# Patient Record
Sex: Female | Born: 1980 | Race: Black or African American | Hispanic: No | Marital: Single | Smoking: Never smoker
Health system: Southern US, Community
[De-identification: ages and names within clinical notes are randomized; demographics above are authoritative.]

## PROBLEM LIST (undated history)

## (undated) ENCOUNTER — Inpatient Hospital Stay (HOSPITAL_COMMUNITY): Payer: Self-pay

## (undated) DIAGNOSIS — Z8619 Personal history of other infectious and parasitic diseases: Secondary | ICD-10-CM

## (undated) DIAGNOSIS — A499 Bacterial infection, unspecified: Secondary | ICD-10-CM

## (undated) DIAGNOSIS — R1110 Vomiting, unspecified: Secondary | ICD-10-CM

## (undated) DIAGNOSIS — Z87448 Personal history of other diseases of urinary system: Secondary | ICD-10-CM

## (undated) DIAGNOSIS — R87629 Unspecified abnormal cytological findings in specimens from vagina: Secondary | ICD-10-CM

## (undated) DIAGNOSIS — Z87440 Personal history of urinary (tract) infections: Secondary | ICD-10-CM

## (undated) DIAGNOSIS — B379 Candidiasis, unspecified: Secondary | ICD-10-CM

## (undated) DIAGNOSIS — IMO0002 Reserved for concepts with insufficient information to code with codable children: Secondary | ICD-10-CM

## (undated) DIAGNOSIS — O09899 Supervision of other high risk pregnancies, unspecified trimester: Secondary | ICD-10-CM

## (undated) DIAGNOSIS — F419 Anxiety disorder, unspecified: Secondary | ICD-10-CM

## (undated) DIAGNOSIS — D649 Anemia, unspecified: Secondary | ICD-10-CM

## (undated) HISTORY — DX: Supervision of other high risk pregnancies, unspecified trimester: O09.899

## (undated) HISTORY — DX: Reserved for concepts with insufficient information to code with codable children: IMO0002

## (undated) HISTORY — PX: NO PAST SURGERIES: SHX2092

## (undated) HISTORY — DX: Unspecified abnormal cytological findings in specimens from vagina: R87.629

## (undated) HISTORY — DX: Personal history of urinary (tract) infections: Z87.440

## (undated) HISTORY — DX: Personal history of other diseases of urinary system: Z87.448

## (undated) HISTORY — DX: Anemia, unspecified: D64.9

## (undated) HISTORY — DX: Bacterial infection, unspecified: A49.9

## (undated) HISTORY — DX: Candidiasis, unspecified: B37.9

## (undated) HISTORY — DX: Personal history of other infectious and parasitic diseases: Z86.19

---

## 2000-04-27 ENCOUNTER — Emergency Department (HOSPITAL_COMMUNITY): Admission: EM | Admit: 2000-04-27 | Discharge: 2000-04-27 | Payer: Self-pay | Admitting: Emergency Medicine

## 2000-11-25 ENCOUNTER — Emergency Department (HOSPITAL_COMMUNITY): Admission: EM | Admit: 2000-11-25 | Discharge: 2000-11-25 | Payer: Self-pay | Admitting: Emergency Medicine

## 2000-12-04 DIAGNOSIS — D649 Anemia, unspecified: Secondary | ICD-10-CM

## 2000-12-04 DIAGNOSIS — O09899 Supervision of other high risk pregnancies, unspecified trimester: Secondary | ICD-10-CM

## 2000-12-04 HISTORY — DX: Anemia, unspecified: D64.9

## 2000-12-04 HISTORY — DX: Supervision of other high risk pregnancies, unspecified trimester: O09.899

## 2000-12-10 ENCOUNTER — Emergency Department (HOSPITAL_COMMUNITY): Admission: EM | Admit: 2000-12-10 | Discharge: 2000-12-11 | Payer: Self-pay | Admitting: Emergency Medicine

## 2000-12-11 ENCOUNTER — Inpatient Hospital Stay (HOSPITAL_COMMUNITY): Admission: AD | Admit: 2000-12-11 | Discharge: 2000-12-11 | Payer: Self-pay | Admitting: *Deleted

## 2000-12-28 ENCOUNTER — Inpatient Hospital Stay (HOSPITAL_COMMUNITY): Admission: AD | Admit: 2000-12-28 | Discharge: 2000-12-28 | Payer: Self-pay | Admitting: *Deleted

## 2001-05-18 ENCOUNTER — Inpatient Hospital Stay (HOSPITAL_COMMUNITY): Admission: AD | Admit: 2001-05-18 | Discharge: 2001-05-18 | Payer: Self-pay | Admitting: Obstetrics & Gynecology

## 2001-06-28 ENCOUNTER — Inpatient Hospital Stay (HOSPITAL_COMMUNITY): Admission: AD | Admit: 2001-06-28 | Discharge: 2001-06-28 | Payer: Self-pay | Admitting: Obstetrics and Gynecology

## 2001-07-07 ENCOUNTER — Inpatient Hospital Stay (HOSPITAL_COMMUNITY): Admission: AD | Admit: 2001-07-07 | Discharge: 2001-07-07 | Payer: Self-pay | Admitting: Obstetrics and Gynecology

## 2001-07-20 ENCOUNTER — Inpatient Hospital Stay (HOSPITAL_COMMUNITY): Admission: AD | Admit: 2001-07-20 | Discharge: 2001-07-22 | Payer: Self-pay | Admitting: Obstetrics and Gynecology

## 2001-08-22 ENCOUNTER — Other Ambulatory Visit: Admission: RE | Admit: 2001-08-22 | Discharge: 2001-08-22 | Payer: Self-pay | Admitting: Obstetrics and Gynecology

## 2002-01-26 ENCOUNTER — Emergency Department (HOSPITAL_COMMUNITY): Admission: EM | Admit: 2002-01-26 | Discharge: 2002-01-26 | Payer: Self-pay | Admitting: Emergency Medicine

## 2003-02-28 ENCOUNTER — Emergency Department (HOSPITAL_COMMUNITY): Admission: EM | Admit: 2003-02-28 | Discharge: 2003-03-01 | Payer: Self-pay | Admitting: Emergency Medicine

## 2003-09-02 ENCOUNTER — Emergency Department (HOSPITAL_COMMUNITY): Admission: EM | Admit: 2003-09-02 | Discharge: 2003-09-02 | Payer: Self-pay | Admitting: Emergency Medicine

## 2003-09-02 ENCOUNTER — Encounter: Payer: Self-pay | Admitting: Emergency Medicine

## 2003-09-18 ENCOUNTER — Emergency Department (HOSPITAL_COMMUNITY): Admission: EM | Admit: 2003-09-18 | Discharge: 2003-09-18 | Payer: Self-pay | Admitting: Emergency Medicine

## 2003-12-29 ENCOUNTER — Emergency Department (HOSPITAL_COMMUNITY): Admission: AD | Admit: 2003-12-29 | Discharge: 2003-12-29 | Payer: Self-pay | Admitting: Family Medicine

## 2004-04-14 ENCOUNTER — Emergency Department (HOSPITAL_COMMUNITY): Admission: EM | Admit: 2004-04-14 | Discharge: 2004-04-14 | Payer: Self-pay | Admitting: *Deleted

## 2004-09-04 ENCOUNTER — Emergency Department (HOSPITAL_COMMUNITY): Admission: EM | Admit: 2004-09-04 | Discharge: 2004-09-04 | Payer: Self-pay | Admitting: Family Medicine

## 2004-12-04 DIAGNOSIS — R87619 Unspecified abnormal cytological findings in specimens from cervix uteri: Secondary | ICD-10-CM

## 2004-12-04 DIAGNOSIS — IMO0002 Reserved for concepts with insufficient information to code with codable children: Secondary | ICD-10-CM

## 2004-12-04 HISTORY — DX: Unspecified abnormal cytological findings in specimens from cervix uteri: R87.619

## 2004-12-04 HISTORY — DX: Reserved for concepts with insufficient information to code with codable children: IMO0002

## 2005-06-25 ENCOUNTER — Emergency Department (HOSPITAL_COMMUNITY): Admission: EM | Admit: 2005-06-25 | Discharge: 2005-06-25 | Payer: Self-pay | Admitting: Family Medicine

## 2005-11-17 ENCOUNTER — Emergency Department (HOSPITAL_COMMUNITY): Admission: EM | Admit: 2005-11-17 | Discharge: 2005-11-17 | Payer: Self-pay | Admitting: Family Medicine

## 2005-11-20 ENCOUNTER — Inpatient Hospital Stay (HOSPITAL_COMMUNITY): Admission: AD | Admit: 2005-11-20 | Discharge: 2005-11-20 | Payer: Self-pay | Admitting: Obstetrics & Gynecology

## 2005-12-12 ENCOUNTER — Emergency Department (HOSPITAL_COMMUNITY): Admission: EM | Admit: 2005-12-12 | Discharge: 2005-12-12 | Payer: Self-pay | Admitting: Family Medicine

## 2006-05-22 ENCOUNTER — Emergency Department (HOSPITAL_COMMUNITY): Admission: EM | Admit: 2006-05-22 | Discharge: 2006-05-22 | Payer: Self-pay | Admitting: Family Medicine

## 2006-06-13 ENCOUNTER — Other Ambulatory Visit: Admission: RE | Admit: 2006-06-13 | Discharge: 2006-06-13 | Payer: Self-pay | Admitting: Family Medicine

## 2006-09-27 ENCOUNTER — Emergency Department (HOSPITAL_COMMUNITY): Admission: EM | Admit: 2006-09-27 | Discharge: 2006-09-27 | Payer: Self-pay | Admitting: Emergency Medicine

## 2006-11-09 ENCOUNTER — Emergency Department (HOSPITAL_COMMUNITY): Admission: EM | Admit: 2006-11-09 | Discharge: 2006-11-09 | Payer: Self-pay | Admitting: Emergency Medicine

## 2006-11-12 ENCOUNTER — Emergency Department (HOSPITAL_COMMUNITY): Admission: EM | Admit: 2006-11-12 | Discharge: 2006-11-12 | Payer: Self-pay | Admitting: Family Medicine

## 2007-02-26 ENCOUNTER — Emergency Department (HOSPITAL_COMMUNITY): Admission: EM | Admit: 2007-02-26 | Discharge: 2007-02-26 | Payer: Self-pay | Admitting: Family Medicine

## 2007-03-19 ENCOUNTER — Emergency Department (HOSPITAL_COMMUNITY): Admission: EM | Admit: 2007-03-19 | Discharge: 2007-03-19 | Payer: Self-pay | Admitting: Family Medicine

## 2007-04-01 ENCOUNTER — Emergency Department (HOSPITAL_COMMUNITY): Admission: EM | Admit: 2007-04-01 | Discharge: 2007-04-01 | Payer: Self-pay | Admitting: Family Medicine

## 2007-07-11 ENCOUNTER — Emergency Department (HOSPITAL_COMMUNITY): Admission: EM | Admit: 2007-07-11 | Discharge: 2007-07-11 | Payer: Self-pay | Admitting: Emergency Medicine

## 2007-08-19 ENCOUNTER — Emergency Department (HOSPITAL_COMMUNITY): Admission: EM | Admit: 2007-08-19 | Discharge: 2007-08-19 | Payer: Self-pay | Admitting: Family Medicine

## 2007-10-03 ENCOUNTER — Emergency Department (HOSPITAL_COMMUNITY): Admission: EM | Admit: 2007-10-03 | Discharge: 2007-10-03 | Payer: Self-pay | Admitting: Family Medicine

## 2007-11-15 ENCOUNTER — Emergency Department (HOSPITAL_COMMUNITY): Admission: EM | Admit: 2007-11-15 | Discharge: 2007-11-15 | Payer: Self-pay | Admitting: Family Medicine

## 2007-11-19 ENCOUNTER — Emergency Department (HOSPITAL_COMMUNITY): Admission: EM | Admit: 2007-11-19 | Discharge: 2007-11-19 | Payer: Self-pay | Admitting: Emergency Medicine

## 2008-02-27 ENCOUNTER — Emergency Department (HOSPITAL_COMMUNITY): Admission: EM | Admit: 2008-02-27 | Discharge: 2008-02-27 | Payer: Self-pay | Admitting: Family Medicine

## 2008-04-29 ENCOUNTER — Emergency Department (HOSPITAL_COMMUNITY): Admission: EM | Admit: 2008-04-29 | Discharge: 2008-04-29 | Payer: Self-pay | Admitting: Emergency Medicine

## 2008-05-06 ENCOUNTER — Emergency Department (HOSPITAL_COMMUNITY): Admission: EM | Admit: 2008-05-06 | Discharge: 2008-05-06 | Payer: Self-pay | Admitting: Emergency Medicine

## 2008-05-26 ENCOUNTER — Emergency Department (HOSPITAL_COMMUNITY): Admission: EM | Admit: 2008-05-26 | Discharge: 2008-05-26 | Payer: Self-pay | Admitting: Family Medicine

## 2008-06-03 ENCOUNTER — Emergency Department (HOSPITAL_COMMUNITY): Admission: EM | Admit: 2008-06-03 | Discharge: 2008-06-03 | Payer: Self-pay | Admitting: Family Medicine

## 2008-12-04 DIAGNOSIS — Z8619 Personal history of other infectious and parasitic diseases: Secondary | ICD-10-CM

## 2008-12-04 HISTORY — DX: Personal history of other infectious and parasitic diseases: Z86.19

## 2009-01-03 ENCOUNTER — Emergency Department (HOSPITAL_COMMUNITY): Admission: EM | Admit: 2009-01-03 | Discharge: 2009-01-03 | Payer: Self-pay | Admitting: Family Medicine

## 2009-03-25 ENCOUNTER — Emergency Department (HOSPITAL_COMMUNITY): Admission: EM | Admit: 2009-03-25 | Discharge: 2009-03-25 | Payer: Self-pay | Admitting: Family Medicine

## 2009-04-27 ENCOUNTER — Emergency Department (HOSPITAL_COMMUNITY): Admission: EM | Admit: 2009-04-27 | Discharge: 2009-04-27 | Payer: Self-pay | Admitting: Family Medicine

## 2009-07-03 ENCOUNTER — Emergency Department (HOSPITAL_COMMUNITY): Admission: EM | Admit: 2009-07-03 | Discharge: 2009-07-03 | Payer: Self-pay | Admitting: Emergency Medicine

## 2009-10-04 ENCOUNTER — Emergency Department (HOSPITAL_COMMUNITY): Admission: EM | Admit: 2009-10-04 | Discharge: 2009-10-04 | Payer: Self-pay | Admitting: Family Medicine

## 2009-12-04 DIAGNOSIS — Z87448 Personal history of other diseases of urinary system: Secondary | ICD-10-CM

## 2009-12-04 HISTORY — DX: Personal history of other diseases of urinary system: Z87.448

## 2009-12-04 HISTORY — PX: INDUCED ABORTION: SHX677

## 2010-01-19 ENCOUNTER — Emergency Department (HOSPITAL_COMMUNITY): Admission: EM | Admit: 2010-01-19 | Discharge: 2010-01-19 | Payer: Self-pay | Admitting: Emergency Medicine

## 2010-03-26 ENCOUNTER — Inpatient Hospital Stay (HOSPITAL_COMMUNITY): Admission: AD | Admit: 2010-03-26 | Discharge: 2010-03-26 | Payer: Self-pay | Admitting: Obstetrics and Gynecology

## 2010-03-31 ENCOUNTER — Inpatient Hospital Stay (HOSPITAL_COMMUNITY): Admission: AD | Admit: 2010-03-31 | Discharge: 2010-03-31 | Payer: Self-pay | Admitting: Obstetrics & Gynecology

## 2010-11-30 ENCOUNTER — Emergency Department (HOSPITAL_COMMUNITY)
Admission: EM | Admit: 2010-11-30 | Discharge: 2010-11-30 | Payer: Self-pay | Source: Home / Self Care | Admitting: Family Medicine

## 2010-12-04 DIAGNOSIS — Z87440 Personal history of urinary (tract) infections: Secondary | ICD-10-CM

## 2010-12-04 HISTORY — DX: Personal history of urinary (tract) infections: Z87.440

## 2011-02-13 LAB — URINE CULTURE
Colony Count: 75000
Culture  Setup Time: 201112282138

## 2011-02-13 LAB — POCT URINALYSIS DIPSTICK
Bilirubin Urine: NEGATIVE
Glucose, UA: NEGATIVE mg/dL
Ketones, ur: NEGATIVE mg/dL
Nitrite: NEGATIVE
Protein, ur: 100 mg/dL — AB
Specific Gravity, Urine: 1.015 (ref 1.005–1.030)
Urobilinogen, UA: 4 mg/dL — ABNORMAL HIGH (ref 0.0–1.0)
pH: 7.5 (ref 5.0–8.0)

## 2011-02-13 LAB — POCT PREGNANCY, URINE: Preg Test, Ur: NEGATIVE

## 2011-02-21 LAB — URINALYSIS, ROUTINE W REFLEX MICROSCOPIC
Bilirubin Urine: NEGATIVE
Bilirubin Urine: NEGATIVE
Glucose, UA: NEGATIVE mg/dL
Glucose, UA: NEGATIVE mg/dL
Ketones, ur: 40 mg/dL — AB
Ketones, ur: NEGATIVE mg/dL
Nitrite: POSITIVE — AB
Nitrite: POSITIVE — AB
Protein, ur: NEGATIVE mg/dL
Protein, ur: NEGATIVE mg/dL
Specific Gravity, Urine: 1.02 (ref 1.005–1.030)
Specific Gravity, Urine: 1.025 (ref 1.005–1.030)
Urobilinogen, UA: 0.2 mg/dL (ref 0.0–1.0)
Urobilinogen, UA: 2 mg/dL — ABNORMAL HIGH (ref 0.0–1.0)
pH: 6 (ref 5.0–8.0)
pH: 6 (ref 5.0–8.0)

## 2011-02-21 LAB — URINE MICROSCOPIC-ADD ON

## 2011-02-21 LAB — URINE CULTURE: Colony Count: >=100000

## 2011-02-21 LAB — CBC
HCT: 39.3 % (ref 36.0–46.0)
Hemoglobin: 13.3 g/dL (ref 12.0–15.0)
MCHC: 33.8 g/dL (ref 30.0–36.0)
MCV: 90.2 fL (ref 78.0–100.0)
Platelets: 265 10*3/uL (ref 150–400)
RBC: 4.36 MIL/uL (ref 3.87–5.11)
RDW: 13.6 % (ref 11.5–15.5)
WBC: 7.2 10*3/uL (ref 4.0–10.5)

## 2011-02-21 LAB — WET PREP, GENITAL
Trich, Wet Prep: NONE SEEN
Yeast Wet Prep HPF POC: NONE SEEN

## 2011-02-21 LAB — GC/CHLAMYDIA PROBE AMP, GENITAL
Chlamydia, DNA Probe: NEGATIVE
GC Probe Amp, Genital: NEGATIVE

## 2011-02-21 LAB — POCT PREGNANCY, URINE: Preg Test, Ur: POSITIVE

## 2011-02-21 LAB — HCG, QUANTITATIVE, PREGNANCY: hCG, Beta Chain, Quant, S: 35308 m[IU]/mL — ABNORMAL HIGH (ref <5)

## 2011-03-08 LAB — GC/CHLAMYDIA PROBE AMP, GENITAL
Chlamydia, DNA Probe: NEGATIVE
GC Probe Amp, Genital: POSITIVE — AB

## 2011-03-08 LAB — WET PREP, GENITAL
Trich, Wet Prep: NONE SEEN
Yeast Wet Prep HPF POC: NONE SEEN

## 2011-03-08 LAB — POCT URINALYSIS DIP (DEVICE)
Bilirubin Urine: NEGATIVE
Glucose, UA: NEGATIVE mg/dL
Ketones, ur: NEGATIVE mg/dL
Nitrite: NEGATIVE
Protein, ur: NEGATIVE mg/dL
Specific Gravity, Urine: 1.015 (ref 1.005–1.030)
Urobilinogen, UA: 0.2 mg/dL (ref 0.0–1.0)
pH: 7.5 (ref 5.0–8.0)

## 2011-03-08 LAB — POCT PREGNANCY, URINE: Preg Test, Ur: NEGATIVE

## 2011-03-12 LAB — POCT PREGNANCY, URINE: Preg Test, Ur: NEGATIVE

## 2011-03-14 LAB — WET PREP, GENITAL
Trich, Wet Prep: NONE SEEN
Yeast Wet Prep HPF POC: NONE SEEN

## 2011-03-14 LAB — POCT URINALYSIS DIP (DEVICE)
Bilirubin Urine: NEGATIVE
Ketones, ur: NEGATIVE mg/dL
pH: 7 (ref 5.0–8.0)

## 2011-03-14 LAB — HIV ANTIBODY (ROUTINE TESTING W REFLEX): HIV: NONREACTIVE

## 2011-03-14 LAB — POCT PREGNANCY, URINE: Preg Test, Ur: NEGATIVE

## 2011-03-15 LAB — POCT URINALYSIS DIP (DEVICE)
Bilirubin Urine: NEGATIVE
Nitrite: POSITIVE — AB
Urobilinogen, UA: 0.2 mg/dL (ref 0.0–1.0)
pH: 6.5 (ref 5.0–8.0)

## 2011-03-15 LAB — URINE CULTURE: Colony Count: >=100000

## 2011-05-04 ENCOUNTER — Inpatient Hospital Stay (INDEPENDENT_AMBULATORY_CARE_PROVIDER_SITE_OTHER)
Admission: RE | Admit: 2011-05-04 | Discharge: 2011-05-04 | Disposition: A | Discharge status: Home / Self Care | Payer: Self-pay | Source: Ambulatory Visit | Attending: Family Medicine | Admitting: Family Medicine

## 2011-05-04 DIAGNOSIS — N390 Urinary tract infection, site not specified: Secondary | ICD-10-CM

## 2011-05-04 LAB — POCT URINALYSIS DIP (DEVICE)
Glucose, UA: NEGATIVE mg/dL
Specific Gravity, Urine: 1.015 (ref 1.005–1.030)
Urobilinogen, UA: 0.2 mg/dL (ref 0.0–1.0)

## 2011-05-04 LAB — POCT PREGNANCY, URINE: Preg Test, Ur: NEGATIVE

## 2011-06-16 ENCOUNTER — Inpatient Hospital Stay (INDEPENDENT_AMBULATORY_CARE_PROVIDER_SITE_OTHER)
Admission: RE | Admit: 2011-06-16 | Discharge: 2011-06-16 | Disposition: A | Discharge status: Home / Self Care | Payer: Self-pay | Source: Ambulatory Visit | Attending: Emergency Medicine | Admitting: Emergency Medicine

## 2011-06-16 DIAGNOSIS — F410 Panic disorder [episodic paroxysmal anxiety] without agoraphobia: Secondary | ICD-10-CM

## 2011-08-28 LAB — POCT URINALYSIS DIP (DEVICE)
Glucose, UA: NEGATIVE
Nitrite: POSITIVE — AB
Protein, ur: NEGATIVE
Urobilinogen, UA: 0.2
pH: 7.5

## 2011-08-28 LAB — WET PREP, GENITAL
Trich, Wet Prep: NONE SEEN
Yeast Wet Prep HPF POC: NONE SEEN

## 2011-08-28 LAB — GC/CHLAMYDIA PROBE AMP, GENITAL
Chlamydia, DNA Probe: POSITIVE — AB
GC Probe Amp, Genital: NEGATIVE

## 2011-08-28 LAB — POCT PREGNANCY, URINE: Preg Test, Ur: NEGATIVE

## 2011-08-28 LAB — URINE CULTURE

## 2011-08-30 LAB — POCT URINALYSIS DIP (DEVICE)
Bilirubin Urine: NEGATIVE
Glucose, UA: NEGATIVE
Ketones, ur: NEGATIVE
pH: 5.5

## 2011-08-30 LAB — WET PREP, GENITAL
Trich, Wet Prep: NONE SEEN
Yeast Wet Prep HPF POC: NONE SEEN

## 2011-08-30 LAB — GC/CHLAMYDIA PROBE AMP, GENITAL: Chlamydia, DNA Probe: POSITIVE — AB

## 2011-08-30 LAB — URINE CULTURE

## 2011-08-31 LAB — POCT URINALYSIS DIP (DEVICE)
Bilirubin Urine: NEGATIVE
Glucose, UA: NEGATIVE
Ketones, ur: NEGATIVE
Nitrite: NEGATIVE
Nitrite: NEGATIVE
Operator id: 235561
Protein, ur: NEGATIVE
Protein, ur: NEGATIVE
Specific Gravity, Urine: 1.015
Urobilinogen, UA: 0.2
pH: 7

## 2011-08-31 LAB — POCT PREGNANCY, URINE
Operator id: 235561
Preg Test, Ur: NEGATIVE
Preg Test, Ur: NEGATIVE

## 2011-09-08 LAB — POCT PREGNANCY, URINE: Preg Test, Ur: NEGATIVE

## 2011-09-08 LAB — POCT URINALYSIS DIP (DEVICE)
Nitrite: NEGATIVE
Protein, ur: NEGATIVE
Specific Gravity, Urine: <=1.005
Urobilinogen, UA: 0.2
pH: 6.5

## 2011-09-11 LAB — POCT URINALYSIS DIP (DEVICE)
Bilirubin Urine: NEGATIVE
Ketones, ur: NEGATIVE
Protein, ur: 100 — AB
Specific Gravity, Urine: 1.01
pH: 5.5

## 2011-09-11 LAB — POCT PREGNANCY, URINE: Operator id: 239701

## 2011-09-13 LAB — POCT URINALYSIS DIP (DEVICE)
Bilirubin Urine: NEGATIVE
Glucose, UA: NEGATIVE
Nitrite: NEGATIVE
Operator id: 239701
Specific Gravity, Urine: 1.015
Urobilinogen, UA: 0.2

## 2011-09-13 LAB — I-STAT 8, (EC8 V) (CONVERTED LAB)
BUN: 10
Bicarbonate: 29 — ABNORMAL HIGH
HCT: 44
Hemoglobin: 15
Operator id: 239701
Potassium: 3.6
Sodium: 141
TCO2: 31

## 2011-09-14 LAB — URINE CULTURE: Colony Count: >=100000

## 2011-09-14 LAB — POCT URINALYSIS DIP (DEVICE)
Bilirubin Urine: NEGATIVE
Glucose, UA: NEGATIVE
Specific Gravity, Urine: 1.02

## 2011-09-18 LAB — URINE CULTURE: Colony Count: >=100000

## 2011-09-18 LAB — POCT URINALYSIS DIP (DEVICE)
Specific Gravity, Urine: 1.02
Urobilinogen, UA: 0.2
pH: 5.5

## 2011-09-18 LAB — GC/CHLAMYDIA PROBE AMP, GENITAL: Chlamydia, DNA Probe: NEGATIVE

## 2011-11-22 ENCOUNTER — Inpatient Hospital Stay (HOSPITAL_COMMUNITY): Payer: Medicaid Other

## 2011-11-22 ENCOUNTER — Inpatient Hospital Stay (HOSPITAL_COMMUNITY)
Admission: AD | Admit: 2011-11-22 | Discharge: 2011-11-22 | Disposition: A | Discharge status: Home / Self Care | Payer: Medicaid Other | Source: Ambulatory Visit | Attending: Obstetrics and Gynecology | Admitting: Obstetrics and Gynecology

## 2011-11-22 ENCOUNTER — Encounter (HOSPITAL_COMMUNITY): Payer: Self-pay | Admitting: *Deleted

## 2011-11-22 DIAGNOSIS — O2390 Unspecified genitourinary tract infection in pregnancy, unspecified trimester: Secondary | ICD-10-CM | POA: Insufficient documentation

## 2011-11-22 DIAGNOSIS — A499 Bacterial infection, unspecified: Secondary | ICD-10-CM | POA: Insufficient documentation

## 2011-11-22 DIAGNOSIS — B9689 Other specified bacterial agents as the cause of diseases classified elsewhere: Secondary | ICD-10-CM | POA: Insufficient documentation

## 2011-11-22 DIAGNOSIS — O210 Mild hyperemesis gravidarum: Secondary | ICD-10-CM

## 2011-11-22 DIAGNOSIS — N760 Acute vaginitis: Secondary | ICD-10-CM | POA: Insufficient documentation

## 2011-11-22 HISTORY — DX: Vomiting, unspecified: R11.10

## 2011-11-22 LAB — DIFFERENTIAL
Basophils Absolute: 0 10*3/uL (ref 0.0–0.1)
Basophils Relative: 0 % (ref 0–1)
Eosinophils Absolute: 0 10*3/uL (ref 0.0–0.7)
Eosinophils Relative: 0 % (ref 0–5)

## 2011-11-22 LAB — CBC
MCH: 28.7 pg (ref 26.0–34.0)
MCV: 83.9 fL (ref 78.0–100.0)
Platelets: 275 10*3/uL (ref 150–400)
RDW: 13.4 % (ref 11.5–15.5)
WBC: 9.8 10*3/uL (ref 4.0–10.5)

## 2011-11-22 LAB — URINALYSIS, ROUTINE W REFLEX MICROSCOPIC
Glucose, UA: NEGATIVE mg/dL
Ketones, ur: 15 mg/dL — AB
Leukocytes, UA: NEGATIVE
Protein, ur: NEGATIVE mg/dL

## 2011-11-22 LAB — URINE MICROSCOPIC-ADD ON

## 2011-11-22 LAB — COMPREHENSIVE METABOLIC PANEL
Albumin: 3.9 g/dL (ref 3.5–5.2)
BUN: 10 mg/dL (ref 6–23)
Creatinine, Ser: 0.63 mg/dL (ref 0.50–1.10)
Total Protein: 7.7 g/dL (ref 6.0–8.3)

## 2011-11-22 LAB — WET PREP, GENITAL
Trich, Wet Prep: NONE SEEN
Yeast Wet Prep HPF POC: NONE SEEN

## 2011-11-22 LAB — HCG, QUANTITATIVE, PREGNANCY: hCG, Beta Chain, Quant, S: 14753 m[IU]/mL — ABNORMAL HIGH (ref <5)

## 2011-11-22 MED ORDER — PROMETHAZINE HCL 25 MG PO TABS: 25.0000 mg | ORAL_TABLET | Freq: Four times a day (QID) | ORAL | Status: DC | PRN

## 2011-11-22 MED ORDER — ONDANSETRON HCL 4 MG PO TABS: 4.0000 mg | ORAL_TABLET | Freq: Four times a day (QID) | ORAL | Status: DC

## 2011-11-22 MED ORDER — ONDANSETRON HCL 4 MG/2ML IJ SOLN
4.0000 mg | Freq: Once | INTRAMUSCULAR | Status: AC
  Administered 2011-11-22: 4 mg via INTRAVENOUS
  Filled 2011-11-22: qty 2

## 2011-11-22 MED ORDER — METRONIDAZOLE 0.75 % VA GEL: 1.0000 | Freq: Two times a day (BID) | VAGINAL | Status: DC

## 2011-11-22 MED ORDER — LACTATED RINGERS IV SOLN
INTRAVENOUS | Status: DC
  Administered 2011-11-22: 22:00:00 via INTRAVENOUS

## 2011-11-22 NOTE — Progress Notes (Signed)
SSE per Mayer Camel, NP.  Wet prep and cultures collected.  VE done.

## 2011-11-22 NOTE — ED Provider Notes (Signed)
History     CSN: [id number] Arrival date & time: 11/22/2011  7:52 PM   None     Chief Complaint  Patient presents with  . Emesis    HPI Megan Ballard is a air entry female @ 6.[redacted] weeks gestation who presents to MAU for persistent vomiting. Had positive pregnancy test at home 3 weeks ago. Vomiting started today at 11:00 am and has not been able to keep anything down.   Past Medical History  Diagnosis Date  . Hyperemesis     Past Surgical History  Procedure Date  . No past surgeries     History reviewed. No pertinent family history.  History  Substance Use Topics  . Smoking status: Never Smoker   . Smokeless tobacco: Not on file  . Alcohol Use: No    OB History    Grav Para Term Preterm Abortions TAB SAB Ect Mult Living   1               Review of Systems  Constitutional: Positive for chills and fatigue. Negative for fever and diaphoresis.  HENT: Negative for ear pain, congestion, sore throat, facial swelling, neck pain, neck stiffness, dental problem and sinus pressure.   Eyes: Negative for photophobia, pain and discharge.  Respiratory: Negative for cough, chest tightness and wheezing.   Cardiovascular: Negative.   Gastrointestinal: Positive for nausea, vomiting and abdominal pain. Negative for diarrhea, constipation and abdominal distention.  Genitourinary: Positive for decreased urine volume, vaginal discharge and pelvic pain. Negative for dysuria, urgency, frequency, flank pain, vaginal bleeding and difficulty urinating.  Musculoskeletal: Negative for myalgias, back pain and gait problem.  Skin: Negative for color change and rash.  Neurological: Positive for light-headedness and headaches. Negative for dizziness, speech difficulty, weakness and numbness.  Psychiatric/Behavioral: Negative for confusion and agitation.    Allergies  Latex  Home Medications  No current outpatient prescriptions on file.  BP 132/89  Pulse 96  Temp(Src) 98.2 F (36.8 C)  (Oral)  Resp 20  Ht 5\' 3"  (1.6 m)  Wt 170 lb (77.111 kg)  BMI 30.11 kg/m2  SpO2 99%  LMP 10/12/2011  Physical Exam  Nursing note and vitals reviewed. Constitutional: She is oriented to person, place, and time. She appears well-developed and well-nourished.  HENT:  Head: Normocephalic.  Eyes: EOM are normal.  Neck: Neck supple.  Pulmonary/Chest: Effort normal.  Abdominal: Soft. There is tenderness.  Musculoskeletal: Normal range of motion.  Neurological: She is alert and oriented to person, place, and time. No cranial nerve deficit.  Skin: Skin is warm and dry.  Psychiatric: She has a normal mood and affect. Her behavior is normal. Judgment and thought content normal.    Results for orders placed during the hospital encounter of 11/22/11 (from the past 24 hour(s))  URINALYSIS, ROUTINE W REFLEX MICROSCOPIC     Status: Abnormal   Collection Time   11/22/11  8:25 PM      Component Value Range   Color, Urine YELLOW  YELLOW    APPearance CLEAR  CLEAR    Specific Gravity, Urine 1.015  1.005 - 1.030    pH 6.0  5.0 - 8.0    Glucose, UA NEGATIVE  NEGATIVE (mg/dL)   Hgb urine dipstick MODERATE (*) NEGATIVE    Bilirubin Urine NEGATIVE  NEGATIVE    Ketones, ur 15 (*) NEGATIVE (mg/dL)   Protein, ur NEGATIVE  NEGATIVE (mg/dL)   Urobilinogen, UA 0.2  0.0 - 1.0 (mg/dL)   Nitrite NEGATIVE  NEGATIVE    Leukocytes, UA NEGATIVE  NEGATIVE   URINE MICROSCOPIC-ADD ON     Status: Abnormal   Collection Time   11/22/11  8:25 PM      Component Value Range   Squamous Epithelial / LPF FEW (*) RARE    WBC, UA 0-2  <3 (WBC/hpf)   RBC / HPF 3-6  <3 (RBC/hpf)   Urine-Other MUCOUS PRESENT    ABO/RH     Status: Normal   Collection Time   11/22/11  9:14 PM      Component Value Range   ABO/RH(D) O POS    CBC     Status: Normal   Collection Time   11/22/11  9:15 PM      Component Value Range   WBC 9.8  4.0 - 10.5 (K/uL)   RBC 4.42  3.87 - 5.11 (MIL/uL)   Hemoglobin 12.7  12.0 - 15.0 (g/dL)   HCT  16.1  09.6 - 04.5 (%)   MCV 83.9  78.0 - 100.0 (fL)   MCH 28.7  26.0 - 34.0 (pg)   MCHC 34.2  30.0 - 36.0 (g/dL)   RDW 40.9  81.1 - 91.4 (%)   Platelets 275  150 - 400 (K/uL)  DIFFERENTIAL     Status: Abnormal   Collection Time   11/22/11  9:15 PM      Component Value Range   Neutrophils Relative 87 (*) 43 - 77 (%)   Neutro Abs 8.5 (*) 1.7 - 7.7 (K/uL)   Lymphocytes Relative 7 (*) 12 - 46 (%)   Lymphs Abs 0.6 (*) 0.7 - 4.0 (K/uL)   Monocytes Relative 6  3 - 12 (%)   Monocytes Absolute 0.6  0.1 - 1.0 (K/uL)   Eosinophils Relative 0  0 - 5 (%)   Eosinophils Absolute 0.0  0.0 - 0.7 (K/uL)   Basophils Relative 0  0 - 1 (%)   Basophils Absolute 0.0  0.0 - 0.1 (K/uL)  COMPREHENSIVE METABOLIC PANEL     Status: Abnormal   Collection Time   11/22/11  9:15 PM      Component Value Range   Sodium 136  135 - 145 (mEq/L)   Potassium 3.8  3.5 - 5.1 (mEq/L)   Chloride 103  96 - 112 (mEq/L)   CO2 21  19 - 32 (mEq/L)   Glucose, Bld 103 (*) 70 - 99 (mg/dL)   BUN 10  6 - 23 (mg/dL)   Creatinine, Ser 7.82  0.50 - 1.10 (mg/dL)   Calcium 9.2  8.4 - 95.6 (mg/dL)   Total Protein 7.7  6.0 - 8.3 (g/dL)   Albumin 3.9  3.5 - 5.2 (g/dL)   AST 17  0 - 37 (U/L)   ALT 12  0 - 35 (U/L)   Alkaline Phosphatase 74  39 - 117 (U/L)   Total Bilirubin 0.4  0.3 - 1.2 (mg/dL)   GFR calc non Af Amer >90  >90 (mL/min)   GFR calc Af Amer >90  >90 (mL/min)  WET PREP, GENITAL     Status: Abnormal   Collection Time   11/22/11 10:00 PM      Component Value Range   Yeast, Wet Prep NONE SEEN  NONE SEEN    Trich, Wet Prep NONE SEEN  NONE SEEN    Clue Cells, Wet Prep MANY (*) NONE SEEN    WBC, Wet Prep HPF POC MODERATE (*) NONE SEEN    US Ob Comp Less 14 Wks  11/22/2011  *RADIOLOGY REPORT*  Clinical Data: Hyperemesis  OBSTETRIC <14 WK ULTRASOUND  Technique:  Transabdominal ultrasound was performed for evaluation of the gestation as well as the maternal uterus and adnexal regions.  Comparison:  None.  Intrauterine  gestational sac: Visualized/normal in shape. Yolk sac: Present Embryo: Present Cardiac Activity: Present Heart Rate: 117 bpm  CRL: 3.1 mm, corresponding to an estimated gestational age of [redacted] weeks 0 days [place] EDC: 07/17/2012  Maternal uterus/Adnexae: No subchorionic hemorrhage.  Left ovary is within normal limits, measuring 1.1 x 3.1 x 1.2 cm.  Right ovary is within normal limits, measuring 1.9 x 2.8 x 2.9 cm.  No free fluid.  IMPRESSION: Single live intrauterine gestation measuring 6 weeks 0 days by crown rump length.  Original Report Authenticated By: Charline Bills, M.D.    Assessment: Hyperemesis   Bacterial vaginosis   First trimester pregnancy @ 6 weeks  Plan:  IV hydration   Zofran   Ultrasound   GC, Chlamydia results pending   Start prenatal care   Metrogel    ED Course  Procedures   MDM   Kerrie Buffalo, NP 11/22/11 2249  Kerrie Buffalo, NP 11/22/11 2250

## 2011-11-22 NOTE — Progress Notes (Signed)
Pt states she had a +home UPT-she started vomiting around 11am and has not stopped

## 2011-11-23 LAB — GC/CHLAMYDIA PROBE AMP, GENITAL: GC Probe Amp, Genital: NEGATIVE

## 2011-11-23 LAB — POCT PREGNANCY, URINE: Preg Test, Ur: POSITIVE

## 2011-11-23 NOTE — ED Provider Notes (Signed)
Attestation of Attending Supervision of Advanced Practitioner: Evaluation and management procedures were performed by the PA/NP/CNM/OB Fellow under my supervision/collaboration. Chart reviewed and agree with management and plan.  Arnetha Silverthorne V 11/23/2011 7:44 AM    

## 2011-11-30 ENCOUNTER — Encounter (HOSPITAL_COMMUNITY): Payer: Self-pay | Admitting: *Deleted

## 2011-11-30 ENCOUNTER — Inpatient Hospital Stay (HOSPITAL_COMMUNITY)
Admission: AD | Admit: 2011-11-30 | Discharge: 2011-12-01 | Payer: Medicaid Other | Source: Ambulatory Visit | Attending: Obstetrics and Gynecology | Admitting: Obstetrics and Gynecology

## 2011-11-30 DIAGNOSIS — J069 Acute upper respiratory infection, unspecified: Secondary | ICD-10-CM | POA: Insufficient documentation

## 2011-11-30 DIAGNOSIS — O99891 Other specified diseases and conditions complicating pregnancy: Secondary | ICD-10-CM | POA: Insufficient documentation

## 2011-11-30 DIAGNOSIS — K117 Disturbances of salivary secretion: Secondary | ICD-10-CM

## 2011-11-30 DIAGNOSIS — O219 Vomiting of pregnancy, unspecified: Secondary | ICD-10-CM

## 2011-11-30 DIAGNOSIS — O210 Mild hyperemesis gravidarum: Secondary | ICD-10-CM | POA: Insufficient documentation

## 2011-11-30 LAB — URINALYSIS, ROUTINE W REFLEX MICROSCOPIC
Ketones, ur: 15 mg/dL — AB
Leukocytes, UA: NEGATIVE
Nitrite: NEGATIVE
Specific Gravity, Urine: 1.025 (ref 1.005–1.030)
pH: 6 (ref 5.0–8.0)

## 2011-11-30 LAB — URINE MICROSCOPIC-ADD ON

## 2011-11-30 MED ORDER — SODIUM CHLORIDE 0.9 % IV SOLN
25.0000 mg | Freq: Once | INTRAVENOUS | Status: AC
  Administered 2011-11-30: 25 mg via INTRAVENOUS
  Filled 2011-11-30: qty 1

## 2011-11-30 MED ORDER — ONDANSETRON HCL 4 MG PO TABS: 4.0000 mg | ORAL_TABLET | Freq: Four times a day (QID) | ORAL | Status: AC

## 2011-11-30 MED ORDER — GLYCOPYRROLATE 1 MG PO TABS: 1.0000 mg | ORAL_TABLET | ORAL | Status: DC | PRN

## 2011-11-30 MED ORDER — GLYCOPYRROLATE 1 MG PO TABS
1.0000 mg | ORAL_TABLET | Freq: Once | ORAL | Status: AC
  Administered 2011-11-30: 1 mg via ORAL
  Filled 2011-11-30: qty 1

## 2011-11-30 MED ORDER — PROMETHAZINE HCL 12.5 MG PO TABS: 25.0000 mg | ORAL_TABLET | Freq: Four times a day (QID) | ORAL | Status: DC | PRN

## 2011-11-30 NOTE — Progress Notes (Signed)
Pt G3 P1 at 7 wks, vomiting, unable to keep anything down.  Denies bleeding or discharge.  Taking treatment for BAV.

## 2011-11-30 NOTE — Progress Notes (Signed)
Crackers provided for pt

## 2011-11-30 NOTE — Progress Notes (Signed)
M. Williams, CNM at bedside.  Assessment done and poc discussed with pt.   

## 2011-11-30 NOTE — ED Provider Notes (Signed)
History     Chief Complaint  Patient presents with  . Emesis During Pregnancy   HPI This is a 30 y.o. female at [redacted]w[redacted]d (confirmed by [place] at 6 weeks) who presents with persistent nausea and vomiting. States is not that nauseated but when nasal drainage hits her stomach she throws up. Denies pain, bleeding or fever Does report excess saliva also.  OB History    Grav Para Term Preterm Abortions TAB SAB Ect Mult Living   3 1 1  0 1 0 1 0 0 1      Past Medical History  Diagnosis Date  . Hyperemesis     Past Surgical History  Procedure Date  . No past surgeries     History reviewed. No pertinent family history.  History  Substance Use Topics  . Smoking status: Never Smoker   . Smokeless tobacco: Not on file  . Alcohol Use: No    Allergies:  Allergies  Allergen Reactions  . Latex Rash    Prescriptions prior to admission  Medication Sig Dispense Refill  . Pseudoephedrine-APAP-DM (TYLENOL COLD/FLU SEVERE DAY) 60-1000-30 MG/30ML LIQD Take by mouth. Sinus cold       . DISCONTD: metroNIDAZOLE (METROGEL VAGINAL) 0.75 % vaginal gel Place 1 Applicatorful vaginally 2 (two) times daily.  70 g  0  . DISCONTD: ondansetron (ZOFRAN) 4 MG tablet Take 1 tablet (4 mg total) by mouth every 6 (six) hours.  12 tablet  0  . DISCONTD: promethazine (PHENERGAN) 25 MG tablet Take 1 tablet (25 mg total) by mouth every 6 (six) hours as needed for nausea.  20 tablet  0    ROS As above  Physical Exam   Blood pressure 135/79, pulse 89, temperature 99.5 F (37.5 C), temperature source Oral, resp. rate 16, height 5\' 3"  (1.6 m), weight 161 lb 12.8 oz (73.392 kg), last menstrual period 10/12/2011, SpO2 98.00%.  Physical Exam  Constitutional: She is oriented to person, place, and time. She appears well-developed and well-nourished. No distress.  HENT:  Head: Normocephalic.  Cardiovascular: Normal rate.   Respiratory: Effort normal.  GI: Soft. She exhibits no distension and no mass. There is no  tenderness. There is no rebound and no guarding.  Musculoskeletal: Normal range of motion.  Neurological: She is alert and oriented to person, place, and time.  Skin: Skin is warm and dry.  Psychiatric: She has a normal mood and affect.    MAU Course  Procedures  Assessment and Plan  A:  IUP at [redacted]w[redacted]d      Hyperemesis      URI with nasal drainage P:  Will hydrate with IVF and give Phenergan      After hydration will d/c home      Will add Robinul 1mg  q 4 hrs prn       Rx Zofran at home  [facility] 11/30/2011, 10:37 PM   Feels better now, Sleepy Will d/c home with Rx Robinul Rn states has not vomited, only spitting

## 2011-11-30 NOTE — Progress Notes (Signed)
Pt presents to mau for nausea and vomiting.  States she has some sinus drainage that drips down her throat and makes her throw up.

## 2011-12-01 MED ORDER — PROMETHAZINE HCL 12.5 MG PO TABS: 25.0000 mg | ORAL_TABLET | Freq: Four times a day (QID) | ORAL | Status: DC | PRN

## 2011-12-01 MED ORDER — GLYCOPYRROLATE 1 MG PO TABS
1.0000 mg | ORAL_TABLET | ORAL | Status: DC | PRN
Start: 2011-12-01 — End: 2011-12-28

## 2011-12-04 NOTE — ED Provider Notes (Signed)
Agree with above note.  Megan Ballard 12/04/2011 1:16 PM

## 2011-12-05 NOTE — L&D Delivery Note (Signed)
Delivery Note  Called by RN secondary to SROM and cervix complete at 0020, mod mec stained fluid, arrived at [facility] at 0045 and pt legs in stirrups and vertex beginning to crown, FHR 140 w decreased variability and early variables, Pt pushed well over 2 ctx    At 12:56 AM a viable female was delivered via Vaginal, Spontaneous Delivery (Presentation: Right Occiput Anterior).  Infant w spontaneous cry, bulb suctioned and handed to awaiting NICU staff for assessment, APGAR: 9, 9; weight 8 lb (3629 g).   Placenta status: Intact, Spontaneous.  Cord: 3 vessels with the following complications: None.  Cord pH: n/a Routine cord blood collected, placenta sent to pathology   Anesthesia: Epidural  Episiotomy: None Lacerations: 1st degree;Periurethral Suture Repair: 3.0 vicryl rapide Est. Blood Loss (mL): 200  Mom to postpartum.  Baby to nursery-stable. Rooming in Pt plans to BF DR Rivard notified   Daveigh Batty M 07/09/2012, 1:55 AM

## 2011-12-28 ENCOUNTER — Inpatient Hospital Stay (HOSPITAL_COMMUNITY)
Admission: AD | Admit: 2011-12-28 | Discharge: 2011-12-28 | Disposition: A | Discharge status: Home / Self Care | Payer: Medicaid Other | Source: Ambulatory Visit | Attending: Obstetrics & Gynecology | Admitting: Obstetrics & Gynecology

## 2011-12-28 ENCOUNTER — Encounter (HOSPITAL_COMMUNITY): Payer: Self-pay

## 2011-12-28 DIAGNOSIS — O2390 Unspecified genitourinary tract infection in pregnancy, unspecified trimester: Secondary | ICD-10-CM | POA: Insufficient documentation

## 2011-12-28 DIAGNOSIS — O219 Vomiting of pregnancy, unspecified: Secondary | ICD-10-CM

## 2011-12-28 DIAGNOSIS — B3731 Acute candidiasis of vulva and vagina: Secondary | ICD-10-CM | POA: Insufficient documentation

## 2011-12-28 DIAGNOSIS — O210 Mild hyperemesis gravidarum: Secondary | ICD-10-CM | POA: Insufficient documentation

## 2011-12-28 DIAGNOSIS — O99891 Other specified diseases and conditions complicating pregnancy: Secondary | ICD-10-CM | POA: Insufficient documentation

## 2011-12-28 DIAGNOSIS — J069 Acute upper respiratory infection, unspecified: Secondary | ICD-10-CM | POA: Insufficient documentation

## 2011-12-28 DIAGNOSIS — B373 Candidiasis of vulva and vagina: Secondary | ICD-10-CM

## 2011-12-28 HISTORY — DX: Anxiety disorder, unspecified: F41.9

## 2011-12-28 LAB — URINE MICROSCOPIC-ADD ON

## 2011-12-28 LAB — CBC
HCT: 36.4 % (ref 36.0–46.0)
MCH: 29.2 pg (ref 26.0–34.0)
MCV: 83.7 fL (ref 78.0–100.0)
Platelets: 369 10*3/uL (ref 150–400)
RBC: 4.35 MIL/uL (ref 3.87–5.11)

## 2011-12-28 LAB — URINALYSIS, ROUTINE W REFLEX MICROSCOPIC
Bilirubin Urine: NEGATIVE
Glucose, UA: NEGATIVE mg/dL
Specific Gravity, Urine: 1.01 (ref 1.005–1.030)
Urobilinogen, UA: 1 mg/dL (ref 0.0–1.0)

## 2011-12-28 LAB — COMPREHENSIVE METABOLIC PANEL
AST: 16 U/L (ref 0–37)
BUN: 9 mg/dL (ref 6–23)
CO2: 26 mEq/L (ref 19–32)
Calcium: 10 mg/dL (ref 8.4–10.5)
Creatinine, Ser: 0.67 mg/dL (ref 0.50–1.10)
GFR calc Af Amer: >90 mL/min (ref >90)
GFR calc non Af Amer: >90 mL/min (ref >90)
Glucose, Bld: 95 mg/dL (ref 70–99)
Total Bilirubin: 0.4 mg/dL (ref 0.3–1.2)

## 2011-12-28 MED ORDER — LACTATED RINGERS IV SOLN
INTRAVENOUS | Status: DC
  Administered 2011-12-28: 15:00:00 via INTRAVENOUS

## 2011-12-28 MED ORDER — GUAIFENESIN 100 MG/5ML PO SOLN
10.0000 mL | Freq: Once | ORAL | Status: AC
  Administered 2011-12-28: 200 mg via ORAL

## 2011-12-28 MED ORDER — GUAIFENESIN 100 MG/5ML PO SOLN
5.0000 mL | Freq: Once | ORAL | Status: DC
  Filled 2011-12-28: qty 15

## 2011-12-28 MED ORDER — ONDANSETRON HCL 4 MG/2ML IJ SOLN
4.0000 mg | Freq: Once | INTRAMUSCULAR | Status: AC
  Administered 2011-12-28: 4 mg via INTRAVENOUS
  Filled 2011-12-28: qty 2

## 2011-12-28 MED ORDER — FLUCONAZOLE 150 MG PO TABS
150.0000 mg | ORAL_TABLET | Freq: Once | ORAL | Status: AC
  Administered 2011-12-28: 150 mg via ORAL
  Filled 2011-12-28: qty 1

## 2011-12-28 NOTE — ED Provider Notes (Signed)
History     CSN: [id number]  Arrival date & time 12/28/11  1256   None     No chief complaint on file.   HPI Megan Ballard is a 31 y.o. female @ [redacted]w[redacted]d gestation who presents to MAU for nausea and vomiting that started 4 days ago. Denies diarrhea or constipation. Also complains of low abdominal pain and vaginal discharge. The discharge is thick white like previous yeast infections. This history was provided by the patient.  Past Medical History  Diagnosis Date  . Hyperemesis   . Anxiety     Past Surgical History  Procedure Date  . No past surgeries     Family History  Problem Relation Age of Onset  . Anesthesia problems Neg Hx     History  Substance Use Topics  . Smoking status: Never Smoker   . Smokeless tobacco: Never Used  . Alcohol Use: No    OB History    Grav Para Term Preterm Abortions TAB SAB Ect Mult Living   3 1 1  0 1 0 1 0 0 1      Review of Systems  Constitutional: Negative for fever, chills, diaphoresis and fatigue.  HENT: Positive for congestion, sore throat and sinus pressure. Negative for ear pain, facial swelling, neck pain, neck stiffness and dental problem.   Eyes: Negative for photophobia, pain and discharge.  Respiratory: Positive for cough. Negative for chest tightness and wheezing.   Cardiovascular: Negative.   Gastrointestinal: Positive for nausea, vomiting and abdominal pain. Negative for diarrhea, constipation and abdominal distention.  Genitourinary: Positive for dysuria and vaginal discharge. Negative for frequency, flank pain, vaginal bleeding and difficulty urinating.  Musculoskeletal: Positive for back pain. Negative for myalgias and gait problem.  Skin: Negative for color change and rash.  Neurological: Positive for light-headedness. Negative for dizziness, speech difficulty, weakness, numbness and headaches.  Psychiatric/Behavioral: Negative for confusion and agitation. The patient is not nervous/anxious.     Allergies    Latex  Home Medications  No current outpatient prescriptions on file.  BP 115/91  Pulse 102  Temp(Src) 98.7 F (37.1 C) (Oral)  Resp 20  Ht 5' 3.5" (1.613 m)  Wt 156 lb (70.761 kg)  BMI 27.20 kg/m2  SpO2 97%  LMP 10/12/2011  Physical Exam  Nursing note and vitals reviewed. Constitutional: She is oriented to person, place, and time. She appears well-developed and well-nourished. No distress.  HENT:  Head: Normocephalic.       Nasal congestion  Eyes: EOM are normal.  Neck: Neck supple.  Cardiovascular:       Tachycardia   Pulmonary/Chest: Effort normal.  Abdominal: Soft. There is no tenderness.  Genitourinary:       External genitalia without lesions. Thick white cheesy discharge vaginal vault. Cervix closed, long, no CMT, no adnexal tenderness or mass palpated, uterus enlarged and consistent with dates.   Musculoskeletal: Normal range of motion.  Neurological: She is alert and oriented to person, place, and time. No cranial nerve deficit.  Skin: Skin is warm and dry.  Psychiatric: She has a normal mood and affect. Her behavior is normal. Judgment and thought content normal.   Results for orders placed during the hospital encounter of 12/28/11 (from the past 24 hour(s))  URINALYSIS, ROUTINE W REFLEX MICROSCOPIC     Status: Abnormal   Collection Time   12/28/11  1:17 PM      Component Value Range   Color, Urine YELLOW  YELLOW    APPearance CLEAR  CLEAR    Specific Gravity, Urine 1.010  1.005 - 1.030    pH 7.5  5.0 - 8.0    Glucose, UA NEGATIVE  NEGATIVE (mg/dL)   Hgb urine dipstick SMALL (*) NEGATIVE    Bilirubin Urine NEGATIVE  NEGATIVE    Ketones, ur NEGATIVE  NEGATIVE (mg/dL)   Protein, ur 30 (*) NEGATIVE (mg/dL)   Urobilinogen, UA 1.0  0.0 - 1.0 (mg/dL)   Nitrite NEGATIVE  NEGATIVE    Leukocytes, UA NEGATIVE  NEGATIVE   URINE MICROSCOPIC-ADD ON     Status: Abnormal   Collection Time   12/28/11  1:17 PM      Component Value Range   Squamous Epithelial / LPF  FEW (*) RARE    RBC / HPF 3-6  <3 (RBC/hpf)  CBC     Status: Abnormal   Collection Time   12/28/11  1:42 PM      Component Value Range   WBC 11.4 (*) 4.0 - 10.5 (K/uL)   RBC 4.35  3.87 - 5.11 (MIL/uL)   Hemoglobin 12.7  12.0 - 15.0 (g/dL)   HCT 21.3  08.6 - 57.8 (%)   MCV 83.7  78.0 - 100.0 (fL)   MCH 29.2  26.0 - 34.0 (pg)   MCHC 34.9  30.0 - 36.0 (g/dL)   RDW 46.9  62.9 - 52.8 (%)   Platelets 369  150 - 400 (K/uL)  COMPREHENSIVE METABOLIC PANEL     Status: Abnormal   Collection Time   12/28/11  1:42 PM      Component Value Range   Sodium 135  135 - 145 (mEq/L)   Potassium 3.4 (*) 3.5 - 5.1 (mEq/L)   Chloride 94 (*) 96 - 112 (mEq/L)   CO2 26  19 - 32 (mEq/L)   Glucose, Bld 95  70 - 99 (mg/dL)   BUN 9  6 - 23 (mg/dL)   Creatinine, Ser 4.13  0.50 - 1.10 (mg/dL)   Calcium 24.4  8.4 - 10.5 (mg/dL)   Total Protein 8.1  6.0 - 8.3 (g/dL)   Albumin 3.8  3.5 - 5.2 (g/dL)   AST 16  0 - 37 (U/L)   ALT 11  0 - 35 (U/L)   Alkaline Phosphatase 89  39 - 117 (U/L)   Total Bilirubin 0.4  0.3 - 1.2 (mg/dL)   GFR calc non Af Amer >90  >90 (mL/min)   GFR calc Af Amer >90  >90 (mL/min)  WET PREP, GENITAL     Status: Abnormal   Collection Time   12/28/11  5:16 PM      Component Value Range   Yeast, Wet Prep FEW (*) NONE SEEN    Trich, Wet Prep NONE SEEN  NONE SEEN    Clue Cells, Wet Prep NONE SEEN  NONE SEEN    WBC, Wet Prep HPF POC NONE SEEN  NONE SEEN    Assessment:  URI   Nausea and vomiting in pregnancy   Monilia vaginosis   Plan:  Robitussin OTC   Diflucan 150 mg po   Zofran (has at home)   Follow up for prenatal care   Return here as needed.   ED Course  Procedures MDM          [facility], NP 12/28/11 [address] [place], NP 12/28/11 [phone]

## 2011-12-28 NOTE — ED Notes (Signed)
Pt constantly clearing throat and spitting.  Mask off.

## 2011-12-28 NOTE — Progress Notes (Signed)
Called nurses line, 'signs of dehydration'.   Been throwing up past 4-5 days, throat is really  Sore, denies body ache or fever.  Horrible yeast infection.  Mask was placed on pt at front desk.

## 2011-12-28 NOTE — Progress Notes (Signed)
Pt states took phenergan 2 days ago, zofran last pm, constant spitting. Denies bleeding, has a yeast infection x4 days, not yet used any otc meds. Lower abd cramping, has burning with voiding.

## 2011-12-29 LAB — GC/CHLAMYDIA PROBE AMP, GENITAL: GC Probe Amp, Genital: NEGATIVE

## 2012-01-17 DIAGNOSIS — Z91040 Latex allergy status: Secondary | ICD-10-CM | POA: Insufficient documentation

## 2012-01-19 DIAGNOSIS — K117 Disturbances of salivary secretion: Secondary | ICD-10-CM | POA: Insufficient documentation

## 2012-01-19 DIAGNOSIS — D573 Sickle-cell trait: Secondary | ICD-10-CM | POA: Insufficient documentation

## 2012-01-19 DIAGNOSIS — D649 Anemia, unspecified: Secondary | ICD-10-CM | POA: Insufficient documentation

## 2012-01-19 LAB — OB RESULTS CONSOLE HIV ANTIBODY (ROUTINE TESTING): HIV: NONREACTIVE

## 2012-01-19 LAB — OB RESULTS CONSOLE RUBELLA ANTIBODY, IGM: Rubella: IMMUNE

## 2012-02-06 ENCOUNTER — Other Ambulatory Visit (INDEPENDENT_AMBULATORY_CARE_PROVIDER_SITE_OTHER): Payer: Medicaid Other

## 2012-02-06 DIAGNOSIS — Z331 Pregnant state, incidental: Secondary | ICD-10-CM

## 2012-02-19 ENCOUNTER — Encounter: Payer: Self-pay | Admitting: Obstetrics and Gynecology

## 2012-02-19 ENCOUNTER — Other Ambulatory Visit: Payer: Medicaid Other

## 2012-02-20 ENCOUNTER — Encounter (INDEPENDENT_AMBULATORY_CARE_PROVIDER_SITE_OTHER): Payer: Medicaid Other | Admitting: Obstetrics and Gynecology

## 2012-02-20 DIAGNOSIS — Z331 Pregnant state, incidental: Secondary | ICD-10-CM

## 2012-02-20 DIAGNOSIS — N898 Other specified noninflammatory disorders of vagina: Secondary | ICD-10-CM

## 2012-02-26 ENCOUNTER — Other Ambulatory Visit: Payer: Medicaid Other

## 2012-02-26 ENCOUNTER — Encounter (INDEPENDENT_AMBULATORY_CARE_PROVIDER_SITE_OTHER): Payer: Medicaid Other | Admitting: Obstetrics and Gynecology

## 2012-02-26 DIAGNOSIS — Z1389 Encounter for screening for other disorder: Secondary | ICD-10-CM

## 2012-02-26 DIAGNOSIS — D573 Sickle-cell trait: Secondary | ICD-10-CM

## 2012-03-13 ENCOUNTER — Telehealth: Payer: Self-pay | Admitting: Obstetrics and Gynecology

## 2012-03-13 NOTE — Telephone Encounter (Signed)
TC TO PT REGARDING MSG, 22 WKS WITH ALLERGIES HAS WATERY EYES, WANTS TO KNOW WHAT TO TAKE. LM ON VM TO RTN CALL

## 2012-03-15 NOTE — Telephone Encounter (Signed)
TC TO PT REGARDING MSG AGAIN, WAS UNABLE TO LM BC THERE WAS NO VM AVAILABLE.

## 2012-03-25 ENCOUNTER — Other Ambulatory Visit: Payer: Self-pay

## 2012-03-25 DIAGNOSIS — K117 Disturbances of salivary secretion: Secondary | ICD-10-CM

## 2012-03-25 DIAGNOSIS — D649 Anemia, unspecified: Secondary | ICD-10-CM

## 2012-03-25 DIAGNOSIS — D573 Sickle-cell trait: Secondary | ICD-10-CM

## 2012-03-25 DIAGNOSIS — Z91040 Latex allergy status: Secondary | ICD-10-CM

## 2012-03-26 ENCOUNTER — Other Ambulatory Visit: Payer: Medicaid Other

## 2012-03-26 ENCOUNTER — Encounter: Payer: Self-pay | Admitting: Obstetrics and Gynecology

## 2012-03-26 ENCOUNTER — Other Ambulatory Visit: Payer: Self-pay | Admitting: Obstetrics and Gynecology

## 2012-03-26 ENCOUNTER — Ambulatory Visit (INDEPENDENT_AMBULATORY_CARE_PROVIDER_SITE_OTHER): Payer: Medicaid Other | Admitting: Obstetrics and Gynecology

## 2012-03-26 ENCOUNTER — Ambulatory Visit (INDEPENDENT_AMBULATORY_CARE_PROVIDER_SITE_OTHER): Payer: Medicaid Other

## 2012-03-26 VITALS — BP 112/56 | Ht 63.0 in | Wt 172.5 lb

## 2012-03-26 DIAGNOSIS — Z3480 Encounter for supervision of other normal pregnancy, unspecified trimester: Secondary | ICD-10-CM

## 2012-03-26 DIAGNOSIS — D573 Sickle-cell trait: Secondary | ICD-10-CM

## 2012-03-26 LAB — US OB FOLLOW UP

## 2012-03-26 NOTE — Progress Notes (Signed)
U/S c/w dates, cvx 4.0cm, post grade 0 placenta, nl fluid, remainder of anatomy seen and wnl No complaints Doing Well RTO 4wks Glucola at NV

## 2012-04-23 ENCOUNTER — Ambulatory Visit (INDEPENDENT_AMBULATORY_CARE_PROVIDER_SITE_OTHER): Payer: Medicaid Other | Admitting: Obstetrics and Gynecology

## 2012-04-23 ENCOUNTER — Other Ambulatory Visit: Payer: Medicaid Other

## 2012-04-23 VITALS — BP 102/60 | Wt 184.0 lb

## 2012-04-23 DIAGNOSIS — Z331 Pregnant state, incidental: Secondary | ICD-10-CM

## 2012-04-23 LAB — POCT URINALYSIS DIPSTICK
Glucose, UA: NEGATIVE
Nitrite, UA: NEGATIVE
Urobilinogen, UA: NEGATIVE

## 2012-04-23 NOTE — Progress Notes (Signed)
The patient complains of pressure.  Cervix okay. FTP, 25%, ballotable. UA negative because of sickle cell trait. Glucola, hemoglobin, RPR. Return to office in 3 weeks. Dr. Stefano Gaul

## 2012-04-23 NOTE — Progress Notes (Signed)
c/o cramps in both legs, and increased pressure. 1 GTT given today

## 2012-04-24 LAB — HEMOGLOBIN: Hemoglobin: 9.5 g/dL — ABNORMAL LOW (ref 12.0–15.0)

## 2012-04-24 LAB — RPR

## 2012-05-07 ENCOUNTER — Telehealth: Payer: Self-pay | Admitting: Obstetrics and Gynecology

## 2012-05-07 ENCOUNTER — Inpatient Hospital Stay (HOSPITAL_COMMUNITY): Payer: Medicaid Other

## 2012-05-07 ENCOUNTER — Inpatient Hospital Stay (HOSPITAL_COMMUNITY)
Admission: AD | Admit: 2012-05-07 | Discharge: 2012-05-07 | Disposition: A | Discharge status: Home / Self Care | Payer: Medicaid Other | Source: Ambulatory Visit | Attending: Obstetrics and Gynecology | Admitting: Obstetrics and Gynecology

## 2012-05-07 ENCOUNTER — Encounter (HOSPITAL_COMMUNITY): Payer: Self-pay | Admitting: *Deleted

## 2012-05-07 DIAGNOSIS — O4700 False labor before 37 completed weeks of gestation, unspecified trimester: Secondary | ICD-10-CM | POA: Insufficient documentation

## 2012-05-07 DIAGNOSIS — Z331 Pregnant state, incidental: Secondary | ICD-10-CM

## 2012-05-07 DIAGNOSIS — N949 Unspecified condition associated with female genital organs and menstrual cycle: Secondary | ICD-10-CM

## 2012-05-07 LAB — URINALYSIS, ROUTINE W REFLEX MICROSCOPIC
Bilirubin Urine: NEGATIVE
Protein, ur: NEGATIVE mg/dL
Urobilinogen, UA: 0.2 mg/dL (ref 0.0–1.0)

## 2012-05-07 LAB — WET PREP, GENITAL
Clue Cells Wet Prep HPF POC: NONE SEEN
Trich, Wet Prep: NONE SEEN

## 2012-05-07 LAB — URINE MICROSCOPIC-ADD ON

## 2012-05-07 LAB — FETAL FIBRONECTIN: Fetal Fibronectin: NEGATIVE

## 2012-05-07 NOTE — Telephone Encounter (Signed)
Spoke to pt who is a G3P1 30 week OB pt with severe pelvic pain and pressure, irreg ctx and cramping. No LOF, No bleeding, No fever, Good FM. Per SL, she should report to hospital, as there are no appts. Available here today. I called VL and MAU to notify that she will be on her way. Pt is agreeable and will go for eval. Levin Erp

## 2012-05-07 NOTE — MAU Note (Signed)
History   31 yo G4P1021 at 62 2/7 weeks presented c/o pelvic pressure and cramping x several days.  [place] the beach this [place], and felt the baby "dropped" while she was in the water.  Denies leaking, bleeding, or increased d/c.  Differentiates the cramping from more defined contractions that are very occasional.  No IC within 24 hours, no dysuria.  Pregnancy remarkable for: Patient Active Problem List  Diagnoses  . Sickle cell trait  . Anemia  . Latex allergy  . Ptyalism     Chief Complaint  Patient presents with  . Labor Eval    OB History    Grav Para Term Preterm Abortions TAB SAB Ect Mult Living   4 1 1  0 2 0 1 0 0 1      Past Medical History  Diagnosis Date  . Hyperemesis   . Anxiety   . Yeast infection   . Bacterial infection   . H/O varicella   . Abnormal Pap smear 2006  . Anemia 2002  . H/O gestational diabetes in prior pregnancy, currently pregnant 2002  . H/O chlamydia infection 2010  . History of GBS (group B streptococcus) UTI, currently pregnant 2002  . H/O candidiasis   . H/O cystitis 2012  . H/O pyelonephritis 2011    Past Surgical History  Procedure Date  . No past surgeries   . Induced abortion 2011    Family History  Problem Relation Age of Onset  . Anesthesia problems Neg Hx   . Mental illness Father   . Hypertension Maternal Aunt   . Diabetes Maternal Aunt   . Kidney disease Maternal Aunt   . Hypertension Maternal Uncle   . Kidney disease Maternal Uncle   . Alcohol abuse Maternal Uncle   . Heart disease Maternal Grandmother   . Hypertension Maternal Grandmother   . Diabetes Maternal Grandmother   . Kidney disease Maternal Grandmother   . Kidney disease Cousin     History  Substance Use Topics  . Smoking status: Never Smoker   . Smokeless tobacco: Never Used  . Alcohol Use: No    Allergies:  Allergies  Allergen Reactions  . Latex Rash    Prescriptions prior to admission  Medication Sig Dispense Refill  . ondansetron  (ZOFRAN) 4 MG tablet Take 4 mg by mouth every 8 (eight) hours as needed. nausea      . Prenatal Vit-Fe Fumarate-FA (MULTIVITAMIN-PRENATAL) 27-0.8 MG TABS Take 1 tablet by mouth daily.      . promethazine (PHENERGAN) 12.5 MG tablet Take 2 tablets (25 mg total) by mouth every 6 (six) hours as needed for nausea.  30 tablet  0  . promethazine (PHENERGAN) 25 MG tablet Take 25 mg by mouth every 6 (six) hours as needed. nausea         Physical Exam   Last menstrual period 10/15/2011.  Chest clear Heart RRR without murmur Abd gravid, NT Pelvic--closed, long, pp high. Ext WNL  FHR non-reactive, no decels No UCs.  ED Course  IUP at 29 2/7 weeks Pelvic pressure  Amnisure and FFN negative. [place]:  BPP 8/8, normal fluid, EFW 77%ile, cervix 3.8 cm, breech. D/C'd home with comfort measures reviewed and precautions discussed. Keep scheduled appointment at [facility].  Nigel Bridgeman, CNM, [place] 05/07/12 1:30pm

## 2012-05-07 NOTE — MAU Note (Signed)
Braxton hicks past few wks.  Bad pain and pressure since Sunday.  No bleeding or leaking.  Hx of PTL with first preg.

## 2012-05-10 LAB — CULTURE, BETA STREP (GROUP B ONLY)

## 2012-05-14 ENCOUNTER — Encounter: Payer: Medicaid Other | Admitting: Obstetrics and Gynecology

## 2012-05-15 ENCOUNTER — Encounter: Payer: Self-pay | Admitting: Obstetrics and Gynecology

## 2012-05-15 ENCOUNTER — Ambulatory Visit (INDEPENDENT_AMBULATORY_CARE_PROVIDER_SITE_OTHER): Payer: Medicaid Other | Admitting: Obstetrics and Gynecology

## 2012-05-15 VITALS — BP 110/70 | Wt 183.0 lb

## 2012-05-15 DIAGNOSIS — Z331 Pregnant state, incidental: Secondary | ICD-10-CM

## 2012-05-15 NOTE — Progress Notes (Signed)
Pt want to know what can she take for allergies  lm

## 2012-05-15 NOTE — Progress Notes (Signed)
The patient complains of allergy symptoms.  Chest is clear. heart regular rate and rhythm. Throat clear.  Nose congested. Try Sudafed. Glucola 105.  Hemoglobin 9.5.  RPR nonreactive. Seen at [facility].  GC negative. chlamydia negative.  Beta strep negative. wet prep negative. Return to office in 2 weeks.

## 2012-05-29 ENCOUNTER — Ambulatory Visit (INDEPENDENT_AMBULATORY_CARE_PROVIDER_SITE_OTHER): Payer: Medicaid Other | Admitting: Obstetrics and Gynecology

## 2012-05-29 VITALS — BP 100/62 | Wt 190.0 lb

## 2012-05-29 DIAGNOSIS — Z331 Pregnant state, incidental: Secondary | ICD-10-CM

## 2012-05-29 NOTE — Progress Notes (Signed)
Doing well. Ultrasound at 36 weeks to confirm vertex presentation. History of sickle cell trait.  Urine evaluation next visit. Return to office in 2 weeks. Dr. Stefano Gaul

## 2012-05-29 NOTE — Progress Notes (Signed)
Pt states she does not have any concerns today.

## 2012-06-12 ENCOUNTER — Ambulatory Visit (INDEPENDENT_AMBULATORY_CARE_PROVIDER_SITE_OTHER): Payer: Medicaid Other | Admitting: Obstetrics and Gynecology

## 2012-06-12 VITALS — BP 108/70 | Wt 191.0 lb

## 2012-06-12 DIAGNOSIS — Z331 Pregnant state, incidental: Secondary | ICD-10-CM

## 2012-06-12 DIAGNOSIS — O321XX0 Maternal care for breech presentation, not applicable or unspecified: Secondary | ICD-10-CM

## 2012-06-12 LAB — POCT URINALYSIS DIPSTICK
Leukocytes, UA: NEGATIVE
Nitrite, UA: NEGATIVE

## 2012-06-12 NOTE — Progress Notes (Signed)
Doing well. Spitting is better. UA for sickle cell trait- negative. Urine culture sent. Father of the baby not tested.  He seems reluctant. Ultrasound next visit for presentation. Return office in 2 weeks. Dr. Stefano Gaul

## 2012-06-12 NOTE — Progress Notes (Signed)
Pt states she has no concerns today.  

## 2012-06-16 LAB — CULTURE, OB URINE: Colony Count: 60000

## 2012-06-26 ENCOUNTER — Encounter: Payer: Medicaid Other | Admitting: Obstetrics and Gynecology

## 2012-06-26 ENCOUNTER — Ambulatory Visit (INDEPENDENT_AMBULATORY_CARE_PROVIDER_SITE_OTHER): Payer: Medicaid Other | Admitting: Obstetrics and Gynecology

## 2012-06-26 ENCOUNTER — Ambulatory Visit (INDEPENDENT_AMBULATORY_CARE_PROVIDER_SITE_OTHER): Payer: Medicaid Other

## 2012-06-26 ENCOUNTER — Encounter: Payer: Self-pay | Admitting: Obstetrics and Gynecology

## 2012-06-26 ENCOUNTER — Other Ambulatory Visit: Payer: Self-pay | Admitting: Obstetrics and Gynecology

## 2012-06-26 VITALS — BP 110/66 | Wt 191.0 lb

## 2012-06-26 DIAGNOSIS — N898 Other specified noninflammatory disorders of vagina: Secondary | ICD-10-CM

## 2012-06-26 DIAGNOSIS — O409XX0 Polyhydramnios, unspecified trimester, not applicable or unspecified: Secondary | ICD-10-CM

## 2012-06-26 DIAGNOSIS — O321XX0 Maternal care for breech presentation, not applicable or unspecified: Secondary | ICD-10-CM

## 2012-06-26 DIAGNOSIS — O403XX0 Polyhydramnios, third trimester, not applicable or unspecified: Secondary | ICD-10-CM

## 2012-06-26 DIAGNOSIS — Z113 Encounter for screening for infections with a predominantly sexual mode of transmission: Secondary | ICD-10-CM

## 2012-06-26 DIAGNOSIS — Z3685 Encounter for antenatal screening for Streptococcus B: Secondary | ICD-10-CM

## 2012-06-26 LAB — POCT WET PREP (WET MOUNT)
Clue Cells Wet Prep Whiff POC: NEGATIVE
pH: 4.5

## 2012-06-26 NOTE — Addendum Note (Signed)
Addended by: Mathis Bud on: 06/26/2012 01:05 PM   Modules accepted: Orders

## 2012-06-26 NOTE — Progress Notes (Signed)
Ultrasound today estimated fetal weight 7 lbs. 7 oz. 77 percentile AFI 24.1 cm vertex presentation posterior placenta fluid is at the 95th percentile no late presenting fetal anomaly BPP with AFI at NV secondary to Poly FKCs and Labor precautions RTO 1wk

## 2012-06-27 LAB — GC/CHLAMYDIA PROBE AMP, GENITAL: Chlamydia, DNA Probe: NEGATIVE

## 2012-06-27 LAB — US OB FOLLOW UP

## 2012-06-29 LAB — CULTURE, BETA STREP (GROUP B ONLY)

## 2012-07-05 ENCOUNTER — Ambulatory Visit (INDEPENDENT_AMBULATORY_CARE_PROVIDER_SITE_OTHER): Payer: Medicaid Other | Admitting: Obstetrics and Gynecology

## 2012-07-05 ENCOUNTER — Ambulatory Visit (INDEPENDENT_AMBULATORY_CARE_PROVIDER_SITE_OTHER): Payer: Medicaid Other

## 2012-07-05 ENCOUNTER — Encounter: Payer: Self-pay | Admitting: Obstetrics and Gynecology

## 2012-07-05 VITALS — BP 98/66 | Wt 193.0 lb

## 2012-07-05 DIAGNOSIS — O403XX0 Polyhydramnios, third trimester, not applicable or unspecified: Secondary | ICD-10-CM

## 2012-07-05 DIAGNOSIS — O409XX0 Polyhydramnios, unspecified trimester, not applicable or unspecified: Secondary | ICD-10-CM

## 2012-07-05 NOTE — Progress Notes (Signed)
Pt ?'s due date please confirm.  Ultrasound today Polyhydramnios and BPP GBS/CT/GC 06/26/12 WNL

## 2012-07-05 NOTE — Progress Notes (Signed)
Doing well, coping with polyhydramnios well. AFI 27.92, 97%ile, BPP 8/8. Will repeat BPP and AFI at NV. Confirmed EDC information with pt = 07/21/12.

## 2012-07-08 ENCOUNTER — Inpatient Hospital Stay (HOSPITAL_COMMUNITY): Payer: Medicaid Other

## 2012-07-08 ENCOUNTER — Inpatient Hospital Stay (HOSPITAL_COMMUNITY)
Admission: AD | Admit: 2012-07-08 | Discharge: 2012-07-11 | DRG: 774 | Disposition: A | Discharge status: Home / Self Care | Payer: Medicaid Other | Source: Ambulatory Visit | Attending: Obstetrics and Gynecology | Admitting: Obstetrics and Gynecology

## 2012-07-08 ENCOUNTER — Telehealth: Payer: Self-pay | Admitting: Obstetrics and Gynecology

## 2012-07-08 ENCOUNTER — Encounter (HOSPITAL_COMMUNITY): Payer: Self-pay | Admitting: *Deleted

## 2012-07-08 ENCOUNTER — Inpatient Hospital Stay (HOSPITAL_COMMUNITY): Payer: Medicaid Other | Admitting: Anesthesiology

## 2012-07-08 ENCOUNTER — Encounter: Payer: Self-pay | Admitting: Obstetrics and Gynecology

## 2012-07-08 ENCOUNTER — Encounter (HOSPITAL_COMMUNITY): Payer: Self-pay | Admitting: Anesthesiology

## 2012-07-08 DIAGNOSIS — Z87898 Personal history of other specified conditions: Secondary | ICD-10-CM

## 2012-07-08 DIAGNOSIS — Z8619 Personal history of other infectious and parasitic diseases: Secondary | ICD-10-CM

## 2012-07-08 DIAGNOSIS — O09299 Supervision of pregnancy with other poor reproductive or obstetric history, unspecified trimester: Secondary | ICD-10-CM

## 2012-07-08 DIAGNOSIS — Z87448 Personal history of other diseases of urinary system: Secondary | ICD-10-CM

## 2012-07-08 DIAGNOSIS — Z91040 Latex allergy status: Secondary | ICD-10-CM | POA: Diagnosis present

## 2012-07-08 DIAGNOSIS — O409XX0 Polyhydramnios, unspecified trimester, not applicable or unspecified: Secondary | ICD-10-CM | POA: Diagnosis present

## 2012-07-08 DIAGNOSIS — D649 Anemia, unspecified: Secondary | ICD-10-CM | POA: Diagnosis present

## 2012-07-08 DIAGNOSIS — O403XX0 Polyhydramnios, third trimester, not applicable or unspecified: Secondary | ICD-10-CM

## 2012-07-08 DIAGNOSIS — O411230 Chorioamnionitis, third trimester, not applicable or unspecified: Secondary | ICD-10-CM | POA: Diagnosis present

## 2012-07-08 DIAGNOSIS — O411090 Infection of amniotic sac and membranes, unspecified, unspecified trimester, not applicable or unspecified: Secondary | ICD-10-CM | POA: Diagnosis present

## 2012-07-08 DIAGNOSIS — Z8632 Personal history of gestational diabetes: Secondary | ICD-10-CM

## 2012-07-08 DIAGNOSIS — O9902 Anemia complicating childbirth: Secondary | ICD-10-CM | POA: Diagnosis present

## 2012-07-08 LAB — COMPREHENSIVE METABOLIC PANEL
ALT: 8 U/L (ref 0–35)
Albumin: 2.6 g/dL — ABNORMAL LOW (ref 3.5–5.2)
Alkaline Phosphatase: 180 U/L — ABNORMAL HIGH (ref 39–117)
BUN: 5 mg/dL — ABNORMAL LOW (ref 6–23)
Calcium: 9.4 mg/dL (ref 8.4–10.5)
GFR calc Af Amer: >90 mL/min (ref >90)
Glucose, Bld: 79 mg/dL (ref 70–99)
Potassium: 3.7 mEq/L (ref 3.5–5.1)
Sodium: 128 mEq/L — ABNORMAL LOW (ref 135–145)
Total Protein: 6.7 g/dL (ref 6.0–8.3)

## 2012-07-08 LAB — WET PREP, GENITAL
Clue Cells Wet Prep HPF POC: NONE SEEN
Trich, Wet Prep: NONE SEEN

## 2012-07-08 LAB — CBC WITH DIFFERENTIAL/PLATELET
Basophils Relative: 0 % (ref 0–1)
Eosinophils Absolute: 0 10*3/uL (ref 0.0–0.7)
Eosinophils Relative: 0 % (ref 0–5)
Lymphs Abs: 1.3 10*3/uL (ref 0.7–4.0)
MCH: 27.3 pg (ref 26.0–34.0)
MCHC: 33.9 g/dL (ref 30.0–36.0)
MCV: 80.5 fL (ref 78.0–100.0)
Neutrophils Relative %: 79 % — ABNORMAL HIGH (ref 43–77)
Platelets: 257 10*3/uL (ref 150–400)

## 2012-07-08 LAB — URINALYSIS, ROUTINE W REFLEX MICROSCOPIC
Glucose, UA: NEGATIVE mg/dL
Ketones, ur: NEGATIVE mg/dL
Nitrite: NEGATIVE
Specific Gravity, Urine: <1.005 — ABNORMAL LOW (ref 1.005–1.030)
pH: 6 (ref 5.0–8.0)

## 2012-07-08 MED ORDER — IBUPROFEN 600 MG PO TABS
600.0000 mg | ORAL_TABLET | Freq: Four times a day (QID) | ORAL | Status: DC | PRN
  Administered 2012-07-09: 600 mg via ORAL
  Filled 2012-07-08: qty 1

## 2012-07-08 MED ORDER — DIPHENHYDRAMINE HCL 50 MG/ML IJ SOLN: 12.5000 mg | INTRAMUSCULAR | Status: DC | PRN

## 2012-07-08 MED ORDER — LACTATED RINGERS IV SOLN: INTRAVENOUS | Status: DC

## 2012-07-08 MED ORDER — CITRIC ACID-SODIUM CITRATE 334-500 MG/5ML PO SOLN: 30.0000 mL | ORAL | Status: DC | PRN

## 2012-07-08 MED ORDER — LACTATED RINGERS IV BOLUS (SEPSIS)
1000.0000 mL | Freq: Once | INTRAVENOUS | Status: AC
  Administered 2012-07-08: 1000 mL via INTRAVENOUS

## 2012-07-08 MED ORDER — OXYCODONE-ACETAMINOPHEN 5-325 MG PO TABS: 1.0000 | ORAL_TABLET | ORAL | Status: DC | PRN

## 2012-07-08 MED ORDER — LACTATED RINGERS IV SOLN: 500.0000 mL | Freq: Once | INTRAVENOUS | Status: DC

## 2012-07-08 MED ORDER — ACETAMINOPHEN 500 MG PO TABS
1000.0000 mg | ORAL_TABLET | Freq: Once | ORAL | Status: AC
  Administered 2012-07-08: 1000 mg via ORAL
  Filled 2012-07-08: qty 2

## 2012-07-08 MED ORDER — LACTATED RINGERS IV SOLN: 500.0000 mL | INTRAVENOUS | Status: DC | PRN

## 2012-07-08 MED ORDER — ACETAMINOPHEN 500 MG PO TABS: 1000.0000 mg | ORAL_TABLET | ORAL | Status: DC | PRN

## 2012-07-08 MED ORDER — FENTANYL 2.5 MCG/ML BUPIVACAINE 1/10 % EPIDURAL INFUSION (WH - ANES)
14.0000 mL/h | INTRAMUSCULAR | Status: DC
  Administered 2012-07-08: 14 mL/h via EPIDURAL
  Filled 2012-07-08: qty 60

## 2012-07-08 MED ORDER — SODIUM CHLORIDE 0.9 % IV SOLN
3.0000 g | Freq: Four times a day (QID) | INTRAVENOUS | Status: DC
  Administered 2012-07-08: 3 g via INTRAVENOUS
  Filled 2012-07-08 (×4): qty 3

## 2012-07-08 MED ORDER — LIDOCAINE HCL (PF) 1 % IJ SOLN
INTRAMUSCULAR | Status: DC | PRN
  Administered 2012-07-08 (×3): 4 mL

## 2012-07-08 MED ORDER — LIDOCAINE HCL (PF) 1 % IJ SOLN
30.0000 mL | INTRAMUSCULAR | Status: DC | PRN
  Filled 2012-07-08: qty 30

## 2012-07-08 MED ORDER — OXYTOCIN BOLUS FROM INFUSION
250.0000 mL | Freq: Once | INTRAVENOUS | Status: DC
  Filled 2012-07-08: qty 500

## 2012-07-08 MED ORDER — EPHEDRINE 5 MG/ML INJ
10.0000 mg | INTRAVENOUS | Status: DC | PRN
  Filled 2012-07-08: qty 4

## 2012-07-08 MED ORDER — EPHEDRINE 5 MG/ML INJ: 10.0000 mg | INTRAVENOUS | Status: DC | PRN

## 2012-07-08 MED ORDER — PHENYLEPHRINE 40 MCG/ML (10ML) SYRINGE FOR IV PUSH (FOR BLOOD PRESSURE SUPPORT): 80.0000 ug | PREFILLED_SYRINGE | INTRAVENOUS | Status: DC | PRN

## 2012-07-08 MED ORDER — ONDANSETRON HCL 4 MG/2ML IJ SOLN
4.0000 mg | Freq: Once | INTRAMUSCULAR | Status: AC
  Administered 2012-07-08: 4 mg via INTRAVENOUS
  Filled 2012-07-08: qty 2

## 2012-07-08 MED ORDER — OXYTOCIN 40 UNITS IN LACTATED RINGERS INFUSION - SIMPLE MED
1.0000 m[IU]/min | INTRAVENOUS | Status: DC
  Administered 2012-07-09: 2 m[IU]/min via INTRAVENOUS
  Filled 2012-07-08: qty 1000

## 2012-07-08 MED ORDER — PHENYLEPHRINE 40 MCG/ML (10ML) SYRINGE FOR IV PUSH (FOR BLOOD PRESSURE SUPPORT)
80.0000 ug | PREFILLED_SYRINGE | INTRAVENOUS | Status: DC | PRN
  Filled 2012-07-08: qty 5

## 2012-07-08 MED ORDER — OXYTOCIN 10 UNIT/ML IJ SOLN: 10.0000 [IU] | Freq: Once | INTRAMUSCULAR | Status: DC

## 2012-07-08 MED ORDER — TERBUTALINE SULFATE 1 MG/ML IJ SOLN: 0.2500 mg | Freq: Once | INTRAMUSCULAR | Status: AC | PRN

## 2012-07-08 MED ORDER — OXYTOCIN 40 UNITS IN LACTATED RINGERS INFUSION - SIMPLE MED
62.5000 mL/h | Freq: Once | INTRAVENOUS | Status: AC
  Administered 2012-07-09: 62.5 mL/h via INTRAVENOUS

## 2012-07-08 MED ORDER — ONDANSETRON HCL 4 MG/2ML IJ SOLN: 4.0000 mg | Freq: Four times a day (QID) | INTRAMUSCULAR | Status: DC | PRN

## 2012-07-08 NOTE — Progress Notes (Signed)
Patient ID: Megan Ballard, female   DOB: [birth year], 31 y.o.   MRN: [id number] .Subjective: Pt requesting epidural    Objective: BP 127/79  Pulse 80  Temp 98.3 F (36.8 C) (Oral)  Resp 22  Ht 5\' 3"  (1.6 m)  Wt 192 lb (87.091 kg)  BMI 34.01 kg/m2  SpO2 100%  LMP 10/15/2011   FHT:  FHR: 130 bpm, variability: moderate,  accelerations:  Present,  decelerations:  Absent UC:   regular, every 4-5 minutes SVE:   Dilation: 2.5 Effacement (%): 80 Station: -2 Exam by:: S. Makailee Nudelman CNM  ve deferred at present  Assessment / Plan: afebrile now BPP 6/8 (-2 for breathing) AFI subjectively increased  GBS neg but suspect chorioamnionitis  Induction w pitocin Wet prep + yeast, otherwise neg amnisure pending GC/CT pending UA cx sent   Fetal Wellbeing:  Category I Pain Control:  Epidural anesthesia aware   Dr Estanislado Pandy updated   Malissa Hippo 07/08/2012, 11:23 PM

## 2012-07-08 NOTE — H&P (Signed)
Megan Ballard is a 31 y.o. female presenting for labor eval after calling office this afternoon. States she's had ctx since yesterday, but they have gotten progressively more regular and more painful. She denies any VB, LOF, D/C, GFM. Pt states she felt "warm" yesterday (has not taken temp at home), Took tylenol and felt a little better, but felt warm this afternoon as well. States she has had general malaise and nausea since yesterday. Vomited once yesterday. Denies any cough, congestion, SOB or chest pain. Daughter had strep throat about 3 weeks ago, otherwise no one has been ill.    HPI: Pt began PNC at [facility] at 13wks. She was seen in [facility]U for several visits prior to this secondary to n/v. [place] at 6wks was c/w LMP EDC of 8/18. She was dx w BV at 14wks and given flagyl, however BV was still present at 18wks and pt requested metrogel. She initially struggled w hyperemesis, which improved but ptyalism persisted. She was also noted to have anemia and rec FE supplements. She had a normal quad screen at 19wks. Anatomy scan was also normal, tho had limited cardiac views, f/u US was WNL. 1hr gtt at 28wks was 105, hgb 9.5 and RPR NR. [place] was done at 37wks secondary to S>D and polyhydramnios was noted at >95%. BPP was 8/8.     Maternal Medical History:  Reason for admission: Reason for admission: contractions and nausea.  Suspect chorioamnionitis   Contractions: Onset was yesterday.   Frequency: regular.   Perceived severity is moderate.    Fetal activity: Perceived fetal activity is normal.   Last perceived fetal movement was within the past hour.    Prenatal complications: Polyhydramnios.     OB History    Grav Para Term Preterm Abortions TAB SAB Ect Mult Living   4 1 1  0 2 0 1 0 0 1    G1 2/02 SVD 9#2oz - GBS pos, infant had fever at 43 days old G2 2007 SAB, no comps G3 2011 EAB no comps   Past Medical History  Diagnosis Date  . Hyperemesis   . Anxiety   . Yeast infection   . Bacterial  infection   . H/O varicella   . Abnormal Pap smear 2006  . Anemia 2002  . H/O chlamydia infection 2010  . History of GBS (group B streptococcus) UTI, currently pregnant 2002  . H/O candidiasis   . H/O cystitis 2012  . H/O pyelonephritis 2011   Past Surgical History  Procedure Date  . No past surgeries   . Induced abortion 2011   Family History: family history includes Alcohol abuse in her maternal uncle; Diabetes in her maternal aunt and maternal grandmother; Heart disease in her maternal grandmother; Hypertension in her maternal aunt, maternal grandmother, and maternal uncle; Kidney disease in her cousin, maternal aunt, maternal grandmother, and maternal uncle; and Mental illness in her father.  There is no history of Anesthesia problems. Social History:  reports that she has never smoked. She has never used smokeless tobacco. She reports that she does not drink alcohol or use illicit drugs.   Prenatal Transfer Tool  Maternal Diabetes: No Genetic Screening: Normal Maternal Ultrasounds/Referrals: Normal Fetal Ultrasounds or other Referrals:  None Maternal Substance Abuse:  No Significant Maternal Medications:  None Significant Maternal Lab Results:  Lab values include: Other: see prenatal recordanemia, elevated WBC upon admission,  Other Comments:  suspect chorioamnioitis   Review of Systems  Constitutional: Positive for fever and malaise/fatigue.  HENT:  Negative for congestion, sore throat and neck pain.   Respiratory: Negative for cough and shortness of breath.   Cardiovascular: Negative for chest pain.  Gastrointestinal: Positive for nausea, vomiting and abdominal pain. Negative for heartburn, diarrhea and constipation.       Vomited once yesterday, increased nausea yesterday and today Lower pelvic cramping pain w ctx  Genitourinary: Negative for dysuria, urgency, frequency and flank pain.  Musculoskeletal: Positive for myalgias. Negative for back pain, joint pain and falls.    Neurological: Positive for weakness. Negative for dizziness and headaches.  All other systems reviewed and are negative.    Dilation: 2.5 Effacement (%): 80 Station: -2 Exam by:: S. Abryanna Musolino CNM Blood pressure 118/65, pulse 113, temperature 100.2 F (37.9 C), temperature source Oral, resp. rate 18, height 5\' 3"  (1.6 m), weight 192 lb (87.091 kg), last menstrual period 10/15/2011, SpO2 100.00%. Maternal Exam:  Uterine Assessment: Contraction strength is mild.  Contraction duration is 50 seconds. Contraction frequency is irregular.   Abdomen: Fundal height is S>D.   Estimated fetal weight is 7-8.   Fetal presentation: vertex  Introitus: Normal vulva. Vagina is positive for vaginal discharge.  Ferning test: not done.   Pelvis: adequate for delivery.   Cervix: Cervix evaluated by sterile speculum exam and digital exam.   Spec exam after initial VE lg amt greenish mucous, thick, no odor Wet prep and GC/CT sent     Fetal Exam Fetal Monitor Review: Mode: ultrasound.   Baseline rate: 160.  Variability: moderate (6-25 bpm).   Pattern: accelerations present and no decelerations.   Equivocal NST, 1 15x15, and a few 10x10 accels Overall reassuring    Fetal State Assessment: Category II - tracings are indeterminate.     Physical Exam  Nursing note and vitals reviewed. Constitutional: She is oriented to person, place, and time. She appears well-developed and well-nourished.       Winces and breathing w some ctx   HENT:  Head: Normocephalic and atraumatic.  Mouth/Throat: Oropharynx is clear and moist.  Eyes: Pupils are equal, round, and reactive to light.  Neck: Normal range of motion.  Cardiovascular: Regular rhythm and normal heart sounds.        Tachycardia at 100's   Respiratory: Effort normal and breath sounds normal. She has no wheezes.  GI: Soft. Bowel sounds are normal. There is no tenderness.  Genitourinary: Vaginal discharge found.  Musculoskeletal: Normal range  of motion. She exhibits no edema and no tenderness.  Neurological: She is alert and oriented to person, place, and time.  Skin: Skin is warm and dry.  Psychiatric: She has a normal mood and affect. Her behavior is normal.    Prenatal labs: ABO, Rh: --/--/O POS (12/19 2114) Antibody:   neg Rubella:  IMM RPR: NON REAC (05/21 1150)  HBsAg:   neg HIV:   neg GBS: Negative (07/24 0000)  +SCT Pap/GC/CT neg 1hr gtt 105  hgb 9.5  RPR NR   Assessment/Plan: IUP at [redacted]w[redacted]d Fever - Suspect chorioamnionitis, though no history of SROM  Fetal tachycardia, BPP pending, tracing overall reassuring  GBS neg   Probable Early/latent labor, cervix favorable  Known polyhydramnios  Wet prep pending amnisure pending UA cx sent  Admit to b.s. Per c/w Dr Estanislado Pandy Will start pitocin for induction unasyn 3g IV q6 h Epidural when appropriate Will plan to send placenta to pathology   Maudell Stanbrough M 07/08/2012, 10:00 PM

## 2012-07-08 NOTE — MAU Note (Signed)
Contractions q 5 minutes. Fever yesterday, nausea today. Denies bleeding or ROM

## 2012-07-08 NOTE — Telephone Encounter (Signed)
TC from pt.   States is having cont q 5-6 min x 2 hr. +FM. Mucousy vag D/C. States last Pm felt she had a fever.  Does not have thermometer. This AM took Tylenol and felt better but now feels like she is "burning up."  Per DD to [facility].  Pt verbalizes comprehension.

## 2012-07-08 NOTE — Anesthesia Preprocedure Evaluation (Signed)
Anesthesia Evaluation  Patient identified by MRN, date of birth, ID band Patient awake    Reviewed: Allergy & Precautions, H&P , NPO status , Patient's Chart, lab work & pertinent test results, reviewed documented beta blocker date and time   History of Anesthesia Complications Negative for: history of anesthetic complications  Airway Mallampati: III TM Distance: >3 FB Neck ROM: full    Dental  (+) Teeth Intact   Pulmonary neg pulmonary ROS,  breath sounds clear to auscultation        Cardiovascular negative cardio ROS  Rhythm:regular Rate:Normal     Neuro/Psych PSYCHIATRIC DISORDERS (anxiety) negative neurological ROS     GI/Hepatic negative GI ROS, Neg liver ROS,   Endo/Other  negative endocrine ROS  Renal/GU negative Renal ROS     Musculoskeletal   Abdominal   Peds  Hematology  (+) Sickle cell trait and anemia ,   Anesthesia Other Findings   Reproductive/Obstetrics (+) Pregnancy                           Anesthesia Physical Anesthesia Plan  ASA: II  Anesthesia Plan: Epidural   Post-op Pain Management:    Induction:   Airway Management Planned:   Additional Equipment:   Intra-op Plan:   Post-operative Plan:   Informed Consent: I have reviewed the patients History and Physical, chart, labs and discussed the procedure including the risks, benefits and alternatives for the proposed anesthesia with the patient or authorized representative who has indicated his/her understanding and acceptance.     Plan Discussed with:   Anesthesia Plan Comments:         Anesthesia Quick Evaluation

## 2012-07-08 NOTE — Anesthesia Procedure Notes (Signed)
Epidural Patient location during procedure: OB Start time: 07/08/2012 11:37 PM Reason for block: procedure for pain  Staffing Performed by: anesthesiologist   Preanesthetic Checklist Completed: patient identified, site marked, surgical consent, pre-op evaluation, timeout performed, IV checked, risks and benefits discussed and monitors and equipment checked  Epidural Patient position: sitting Prep: site prepped and draped and DuraPrep Patient monitoring: continuous pulse ox and blood pressure Approach: midline Injection technique: LOR air  Needle:  Needle type: Tuohy  Needle gauge: 17 G Needle length: 9 cm Needle insertion depth: 5 cm cm Catheter type: closed end flexible Catheter size: 19 Gauge Catheter at skin depth: 10 cm Test dose: negative  Assessment Events: blood not aspirated, injection not painful, no injection resistance, negative IV test and no paresthesia  Additional Notes Discussed risk of headache, infection, bleeding, nerve injury and failed or incomplete block.  Patient voices understanding and wishes to proceed.

## 2012-07-09 ENCOUNTER — Encounter (HOSPITAL_COMMUNITY): Payer: Self-pay

## 2012-07-09 DIAGNOSIS — O43899 Other placental disorders, unspecified trimester: Secondary | ICD-10-CM

## 2012-07-09 DIAGNOSIS — O368990 Maternal care for other specified fetal problems, unspecified trimester, not applicable or unspecified: Secondary | ICD-10-CM

## 2012-07-09 LAB — GC/CHLAMYDIA PROBE AMP, GENITAL
Chlamydia, DNA Probe: NEGATIVE
GC Probe Amp, Genital: NEGATIVE

## 2012-07-09 MED ORDER — TETANUS-DIPHTH-ACELL PERTUSSIS 5-2.5-18.5 LF-MCG/0.5 IM SUSP
0.5000 mL | Freq: Once | INTRAMUSCULAR | Status: AC
  Administered 2012-07-10: 0.5 mL via INTRAMUSCULAR
  Filled 2012-07-09: qty 0.5

## 2012-07-09 MED ORDER — LANOLIN HYDROUS EX OINT: TOPICAL_OINTMENT | CUTANEOUS | Status: DC | PRN

## 2012-07-09 MED ORDER — SENNOSIDES-DOCUSATE SODIUM 8.6-50 MG PO TABS
2.0000 | ORAL_TABLET | Freq: Every day | ORAL | Status: DC
  Administered 2012-07-09 – 2012-07-10 (×2): 2 via ORAL

## 2012-07-09 MED ORDER — DIBUCAINE 1 % RE OINT
1.0000 | TOPICAL_OINTMENT | RECTAL | Status: DC | PRN
Start: 2012-07-09 — End: 2012-07-11

## 2012-07-09 MED ORDER — ONDANSETRON HCL 4 MG PO TABS: 4.0000 mg | ORAL_TABLET | ORAL | Status: DC | PRN

## 2012-07-09 MED ORDER — BENZOCAINE-MENTHOL 20-0.5 % EX AERO
1.0000 | INHALATION_SPRAY | CUTANEOUS | Status: DC | PRN
Start: 2012-07-09 — End: 2012-07-11
  Filled 2012-07-09: qty 56

## 2012-07-09 MED ORDER — ZOLPIDEM TARTRATE 5 MG PO TABS: 5.0000 mg | ORAL_TABLET | Freq: Every evening | ORAL | Status: DC | PRN

## 2012-07-09 MED ORDER — DIPHENHYDRAMINE HCL 25 MG PO CAPS: 25.0000 mg | ORAL_CAPSULE | Freq: Four times a day (QID) | ORAL | Status: DC | PRN

## 2012-07-09 MED ORDER — WITCH HAZEL-GLYCERIN EX PADS: 1.0000 "application " | MEDICATED_PAD | CUTANEOUS | Status: DC | PRN

## 2012-07-09 MED ORDER — SIMETHICONE 80 MG PO CHEW: 80.0000 mg | CHEWABLE_TABLET | ORAL | Status: DC | PRN

## 2012-07-09 MED ORDER — OXYCODONE-ACETAMINOPHEN 5-325 MG PO TABS
1.0000 | ORAL_TABLET | ORAL | Status: DC | PRN
  Administered 2012-07-09 – 2012-07-10 (×4): 1 via ORAL
  Administered 2012-07-10: 2 via ORAL
  Administered 2012-07-11 (×2): 1 via ORAL
  Filled 2012-07-09 (×3): qty 1
  Filled 2012-07-09: qty 2
  Filled 2012-07-09 (×3): qty 1

## 2012-07-09 MED ORDER — IBUPROFEN 600 MG PO TABS
600.0000 mg | ORAL_TABLET | Freq: Four times a day (QID) | ORAL | Status: DC
  Administered 2012-07-09 – 2012-07-11 (×10): 600 mg via ORAL
  Filled 2012-07-09 (×11): qty 1

## 2012-07-09 MED ORDER — PRENATAL MULTIVITAMIN CH
1.0000 | ORAL_TABLET | Freq: Every day | ORAL | Status: DC
  Administered 2012-07-09 – 2012-07-11 (×3): 1 via ORAL
  Filled 2012-07-09 (×3): qty 1

## 2012-07-09 MED ORDER — MEASLES, MUMPS & RUBELLA VAC ~~LOC~~ INJ
0.5000 mL | INJECTION | Freq: Once | SUBCUTANEOUS | Status: DC
  Filled 2012-07-09: qty 0.5

## 2012-07-09 MED ORDER — ONDANSETRON HCL 4 MG/2ML IJ SOLN: 4.0000 mg | INTRAMUSCULAR | Status: DC | PRN

## 2012-07-09 NOTE — Progress Notes (Signed)
UR Chart review completed.  

## 2012-07-09 NOTE — Anesthesia Postprocedure Evaluation (Signed)
  Anesthesia Post-op Note  Patient: Megan Ballard  Procedure(s) Performed: * No surgery found *  Patient Location: Mother/Baby  Anesthesia Type: Epidural and Bier block  Level of Consciousness: awake and alert   Airway and Oxygen Therapy: Patient Spontanous Breathing  Post-op Pain: none  Post-op Assessment: Patient's Cardiovascular Status Stable, Respiratory Function Stable, Patent Airway, No signs of Nausea or vomiting, Adequate PO intake, Pain level controlled, No headache, No backache, No residual numbness and No residual motor weakness  Post-op Vital Signs: Reviewed and stable  Complications: No apparent anesthesia complications

## 2012-07-09 NOTE — Progress Notes (Signed)
Patient ID: Megan Ballard, female   DOB: [birth year], 31 y.o.   MRN: [id number] .Subjective:  Just now getting comfortable w epidural, still feels ctx on L side  Objective: BP 123/77  Pulse 80  Temp 98.3 F (36.8 C) (Oral)  Resp 22  Ht 5\' 3"  (1.6 m)  Wt 192 lb (87.091 kg)  BMI 34.01 kg/m2  SpO2 100%  LMP 10/15/2011   FHT:  FHR: 140 bpm, variability: moderate,  accelerations:  Present,  decelerations:  Present ? variables, just in the last 15 min, toco not tracing well UC:   regular, every 5-6 minutes SVE:   Dilation: 7 Effacement (%): 100 Station: -1 Exam by:: S. Shevelle Smither, CNM  Will hold on AROM for pt to get pain relief   Assessment / Plan: Spontaneous labor, progressing normally Suspected chorioamnionitis  Afebrile now   Fetal Wellbeing:  Category I Pain Control:  Epidural  Update physician PRN  [name] M 07/09/2012, 12:06 AM

## 2012-07-10 LAB — CBC
Platelets: 220 10*3/uL (ref 150–400)
RDW: 14.2 % (ref 11.5–15.5)
WBC: 10 10*3/uL (ref 4.0–10.5)

## 2012-07-10 LAB — URINE CULTURE

## 2012-07-10 MED ORDER — POLYSACCHARIDE IRON COMPLEX 150 MG PO CAPS
150.0000 mg | ORAL_CAPSULE | Freq: Two times a day (BID) | ORAL | Status: DC
  Filled 2012-07-10 (×2): qty 1

## 2012-07-10 NOTE — Progress Notes (Signed)
Post Partum Day 1 Subjective: up ad lib, voiding, tolerating PO, + flatus and VB has tapered some.  BF'ng and has given some formula.  Planning Depo for [facility].  Several visitors at bedside tonight.  No dizziness this evening.   Objective: Blood pressure 110/76, pulse 68, temperature 97.4 F (36.3 C), temperature source Oral, resp. rate 18, height 5\' 3"  (1.6 m), weight 192 lb (87.091 kg), last menstrual period 10/15/2011, SpO2 98.00%, unknown if currently breastfeeding.  Physical Exam:  General: alert, cooperative, appears stated age and no distress Lochia: appropriate Uterine Fundus: firm, below umbilicus Incision: n/a DVT Evaluation: No evidence of DVT seen on physical exam. Negative Homan's sign.   Basename 07/10/12 0543 07/08/12 2000  HGB 8.7* 10.1*  HCT 26.4* 29.8*    Assessment/Plan: Plan for discharge tomorrow, Breastfeeding, Lactation consult and Contraception DMPA Persisting anemia.  Will begin po iron; pt needs Rx for PNV as well at d/c tomorrow.     LOS: 2 days   Deray Dawes H 07/10/2012, 8:21 PM

## 2012-07-11 MED ORDER — FERROUS SULFATE 325 (65 FE) MG PO TABS: 325.0000 mg | ORAL_TABLET | Freq: Two times a day (BID) | ORAL | Status: DC

## 2012-07-11 MED ORDER — IBUPROFEN 600 MG PO TABS: 600.0000 mg | ORAL_TABLET | Freq: Four times a day (QID) | ORAL | Status: AC | PRN

## 2012-07-11 MED ORDER — MEDROXYPROGESTERONE ACETATE 150 MG/ML IM SUSP: 150.0000 mg | INTRAMUSCULAR | Status: DC

## 2012-07-11 MED ORDER — MEDROXYPROGESTERONE ACETATE 150 MG/ML IM SUSP
150.0000 mg | Freq: Once | INTRAMUSCULAR | Status: AC
  Administered 2012-07-11: 150 mg via INTRAMUSCULAR
  Filled 2012-07-11: qty 1

## 2012-07-11 MED ORDER — SENNOSIDES-DOCUSATE SODIUM 8.6-50 MG PO TABS: 2.0000 | ORAL_TABLET | Freq: Every day | ORAL | Status: AC

## 2012-07-11 NOTE — Discharge Summary (Signed)
Physician Discharge Summary  Patient ID: Megan Ballard MRN: [id number] DOB/AGE: [birth year] 31 y.o.  Admit date: 07/08/2012 Discharge date: 07/11/2012  Admission Diagnoses: [redacted]w[redacted]d active labor, fever   Discharge Diagnoses:  Principal Problem:  *SVD (spontaneous vaginal delivery) Active Problems:  Anemia  Latex allergy   Discharged Condition: stable  Hospital Course: SVD, periurethral lac, normal involution anemia  Consults: None  Significant Diagnostic Studies: labs:  Hemoglobin & Hematocrit     Component Value Date/Time   HGB 8.7* 07/10/2012 0543   HCT 26.4* 07/10/2012 0543  Treatments: IV hydration  Discharge Exam: Blood pressure 128/83, pulse 75, temperature 97.8 F (36.6 C), temperature source Oral, resp. rate 20, height 5\' 3"  (1.6 m), weight 192 lb (87.091 kg), last menstrual period 10/15/2011, SpO2 98.00%, unknown if currently breastfeeding. General appearance: alert, cooperative and no distress S: comfortable, little bleeding, slept some, plans depo    Breast and bottle feeding O VSS     abd soft, nt, ff      sm  Flow perineum clean intact     -Homans sign bilaterally,      No edema  Disposition: 01-Home or Self Care  Discharge Orders    Future Orders Please Complete By Expires   OB RESULTS CONSOLE HIV antibody      Comments:   This external order was created through the Results Console.   OB RESULTS CONSOLE Rubella Antibody      Comments:   This external order was created through the Results Console.   OB RESULTS CONSOLE Hepatitis B surface antigen      Comments:   This external order was created through the Results Console.   OB RESULTS CONSOLE Antibody Screen      Comments:   This external order was created through the Results Console.     Medication List  As of 07/11/2012  8:40 AM   STOP taking these medications         acetaminophen 325 MG tablet      promethazine 12.5 MG tablet         TAKE these medications         ferrous sulfate 325  (65 FE) MG tablet   Take 1 tablet (325 mg total) by mouth 2 (two) times daily.      ibuprofen 600 MG tablet   Commonly known as: ADVIL,MOTRIN   Take 1 tablet (600 mg total) by mouth every 6 (six) hours as needed for pain.      medroxyPROGESTERone 150 MG/ML injection   Commonly known as: DEPO-PROVERA   Inject 1 mL (150 mg total) into the muscle every 3 (three) months.      multivitamin-prenatal 27-0.8 MG Tabs   Take 1 tablet by mouth daily.      senna-docusate 8.6-50 MG per tablet   Commonly known as: Senokot-S   Take 2 tablets by mouth at bedtime.           Follow-up Information    Follow up with [facility] in 6 weeks.       [facility] handbook  SignedLavera Guise 07/11/2012, 8:40 AM

## 2012-07-12 ENCOUNTER — Encounter: Payer: Medicaid Other | Admitting: Obstetrics and Gynecology

## 2012-07-12 ENCOUNTER — Other Ambulatory Visit: Payer: Medicaid Other

## 2012-07-15 ENCOUNTER — Encounter: Payer: Medicaid Other | Admitting: Obstetrics and Gynecology

## 2012-07-24 ENCOUNTER — Telehealth: Payer: Self-pay | Admitting: Obstetrics and Gynecology

## 2012-07-25 ENCOUNTER — Telehealth: Payer: Self-pay

## 2012-07-25 NOTE — Telephone Encounter (Signed)
TC TO PT REGARDING MESSAGE. PT STATES THAT SHE IS HAVING HEADACHES AND FEELS FATIQUE. PT ALSO C/O BRAIN FOG. WANT TO BE EVAL. OFFERED APPT FOR TODAY BUT PT STATES THAT SHE HAS NO WAY OF COMING TODAY. SCHEDULED APPT WITH SR ON 07/26/12 AT 11:40 AM. LOOK FOR LAURA TO APPROVE BUT SHE LEFT FOR TODAY.

## 2012-07-25 NOTE — Telephone Encounter (Signed)
TRIED TO CALL PT BACK. CANNOT GET ANSWER.

## 2012-07-25 NOTE — Telephone Encounter (Signed)
WAS TALKING TO A PT THEN THE PHONE WENT SILENT. TRIED TO CALL PT BACK AND PHONE STATES THE PERSON YOU ARE TRYING TO REACH CANNOT TAKE YOUR CALL NOW.

## 2012-07-26 ENCOUNTER — Encounter: Payer: Medicaid Other | Admitting: Obstetrics and Gynecology

## 2012-08-20 ENCOUNTER — Ambulatory Visit: Payer: Medicaid Other | Admitting: Obstetrics and Gynecology

## 2012-08-28 ENCOUNTER — Ambulatory Visit (INDEPENDENT_AMBULATORY_CARE_PROVIDER_SITE_OTHER): Payer: Medicaid Other | Admitting: Obstetrics and Gynecology

## 2012-08-28 NOTE — Progress Notes (Deleted)
Megan Ballard is a 31 y.o. female who presents for a postpartum visit.   Type of delivery:  ***  Patient reports ***.  Hx remarkable for: ***   PPDS = ***  Contraception plan:  ***   I have fully reviewed the prenatal and intrapartum course   Patient {HAS/HAS NOT:20194} been sexually active since delivery.   The following portions of the patient's history were reviewed and updated as appropriate: allergies, current medications, past family history, past medical history, past social history, past surgical history and problem list.  Review of Systems Pertinent items are noted in HPI.   Objective:    Breastfeeding? Unknown  General:  alert, cooperative and no distress     Lungs: clear to auscultation bilaterally  Heart:  regular rate and rhythm, S1, S2 normal, no murmur  Abdomen: soft, non-tender; bowel sounds normal; no masses,  no organomegaly.   Incision:  ***   Vulva:  normal  Vagina: normal vagina  Cervix:  normal  Uterus: normal size, contour, position, consistency, mobility, non-tender, well-involuted  Adnexa:  normal adnexa             Assessment:     Normal postpartum exam.  Pap smear {done:10129} at today's visit.   Plan:  Follow-up at ***. Rxs:  Nigel Bridgeman CNM, [place] 08/28/2012 8:35 AM

## 2012-08-29 NOTE — Progress Notes (Signed)
DNKA

## 2012-09-04 ENCOUNTER — Ambulatory Visit (INDEPENDENT_AMBULATORY_CARE_PROVIDER_SITE_OTHER): Payer: Medicaid Other | Admitting: Obstetrics and Gynecology

## 2012-09-04 ENCOUNTER — Encounter: Payer: Self-pay | Admitting: Obstetrics and Gynecology

## 2012-09-04 VITALS — BP 104/60 | Temp 98.4°F | Ht 63.0 in | Wt 181.0 lb

## 2012-09-04 DIAGNOSIS — IMO0001 Reserved for inherently not codable concepts without codable children: Secondary | ICD-10-CM

## 2012-09-04 DIAGNOSIS — N200 Calculus of kidney: Secondary | ICD-10-CM

## 2012-09-04 DIAGNOSIS — R319 Hematuria, unspecified: Secondary | ICD-10-CM

## 2012-09-04 DIAGNOSIS — Z309 Encounter for contraceptive management, unspecified: Secondary | ICD-10-CM

## 2012-09-04 LAB — POCT URINALYSIS DIPSTICK
Glucose, UA: NEGATIVE
Nitrite, UA: NEGATIVE
Urobilinogen, UA: NEGATIVE

## 2012-09-04 MED ORDER — MEDROXYPROGESTERONE ACETATE 150 MG/ML IM SUSP: 150.0000 mg | INTRAMUSCULAR | Status: DC

## 2012-09-04 NOTE — Progress Notes (Signed)
Megan Ballard is a 31 y.o. female who presents for a postpartum visit.   Type of delivery:  SVB with 1st degree perineal and periurethral  Patient reports episode of hematuria, fever, chills last week, then felt she passed something, with resolution of sx.  Hx pyelonephritis in past.  Hx remarkable for: PP anemia   PPDS = 10, but feels everything is much better now.  Denies SI or HI, declines need for meds.  Has supportive partner, now getting more sleep, so all has improved.  Contraception plan:  Depo Provera in hospital 07/11/12 Next dose due by 10/03/12.   I have fully reviewed the prenatal and intrapartum course   Patient has not been sexually active since delivery.   The following portions of the patient's history were reviewed and updated as appropriate: allergies, current medications, past family history, past medical history, past social history, past surgical history and problem list.  Review of Systems Pertinent items are noted in HPI.   Objective:    Breastfeeding? Unknown  General:  alert, cooperative and no distress     Lungs: clear to auscultation bilaterally  Heart:  regular rate and rhythm, S1, S2 normal, no murmur  Abdomen: soft, non-tender; bowel sounds normal; no masses,  no organomegaly.   Incision:  NA   Vulva:  normal  Vagina: normal vagina, 1st degree lac well-healed.  Cervix:  normal  Uterus: normal size, contour, position, consistency, mobility, non-tender, well-involuted  Adnexa:  normal adnexa             Assessment:     Normal postpartum exam.  Pap smear not done at today's visit. Due in February 2014. Depo for contraception Likely recent kidney stone--still has hematuria, but no sx  Plan:  Follow-up by 10/31 for next Depo.  Rx present and renewed at pharmacy for 1 year. Urine to culture. To f/u prn with any recurrent urinary sx.    Nigel Bridgeman CNM, [place] 09/04/2012 9:03 AM

## 2012-09-04 NOTE — Progress Notes (Signed)
Date of delivery: 07/09/2012 Female Name: Megan Ballard Vaginal delivery:yes Cesarean section:no Tubal ligation:no GDM:no Breast Feeding:yes Bottle Feeding:no Post-Partum Blues:no Abnormal pap:yes Normal GU function: yes Normal GI function:yes Returning to work:no EPDS: Score-10

## 2012-09-06 ENCOUNTER — Telehealth: Payer: Self-pay | Admitting: Obstetrics and Gynecology

## 2012-09-06 ENCOUNTER — Encounter: Payer: Self-pay | Admitting: Obstetrics and Gynecology

## 2012-09-06 DIAGNOSIS — N390 Urinary tract infection, site not specified: Secondary | ICD-10-CM | POA: Insufficient documentation

## 2012-09-06 NOTE — Telephone Encounter (Signed)
TC to pt. Unable to leave message at home, work or cell numbers.

## 2012-09-06 NOTE — Telephone Encounter (Signed)
Message copied by Mason Jim on Fri Sep 06, 2012  4:03 PM ------      Message from: Cornelius Moras      Created: Fri Sep 06, 2012  2:52 PM      Regarding: + urine culture       Please call patient and inform of + urine culture.      Rx Macrobid 1 po BID x 7 days.      Thanks!      VL

## 2012-09-07 LAB — URINE CULTURE: Colony Count: >=100000

## 2012-09-09 ENCOUNTER — Telehealth: Payer: Self-pay

## 2012-09-09 NOTE — Telephone Encounter (Signed)
Attempted to reach pt regarding Macobid rx. Unable to LM.

## 2012-09-09 NOTE — Telephone Encounter (Signed)
Message copied by Janeece Agee on Mon Sep 09, 2012  9:32 AM ------      Message from: Cornelius Moras      Created: Fri Sep 06, 2012  2:52 PM      Regarding: + urine culture       Please call patient and inform of + urine culture.      Rx Macrobid 1 po BID x 7 days.      Thanks!      VL

## 2012-09-09 NOTE — Telephone Encounter (Signed)
Tc to [employer]  Lm to contact pt. Regarding + urine culture.

## 2012-09-09 NOTE — Telephone Encounter (Signed)
Another attempt to reach pt regarding below.

## 2012-09-11 ENCOUNTER — Other Ambulatory Visit: Payer: Self-pay | Admitting: Obstetrics and Gynecology

## 2012-09-11 ENCOUNTER — Telehealth: Payer: Self-pay | Admitting: Obstetrics and Gynecology

## 2012-09-11 MED ORDER — CEPHALEXIN 500 MG PO CAPS: 500.0000 mg | ORAL_CAPSULE | Freq: Two times a day (BID) | ORAL | Status: DC

## 2012-09-11 NOTE — Telephone Encounter (Signed)
Per Franchot Erichsen, pt has been notified in person and instructed to obtain RX.

## 2012-09-11 NOTE — Telephone Encounter (Signed)
TC to [employer] States she received message and will attempt to contact pt today.  Rx has been ordered. Pt to call office with updated phone number.

## 2012-09-11 NOTE — Telephone Encounter (Signed)
TC from pt. ADvised to start Keflex ASAP. Increase water. Hygiene precautions reviewed. Pt verbalizes comprehension.

## 2012-09-11 NOTE — Telephone Encounter (Signed)
Message copied by Mason Jim on Wed Sep 11, 2012  2:13 PM ------      Message from: Cornelius Moras      Created: Fri Sep 06, 2012  2:52 PM      Regarding: + urine culture       Please call patient and inform of + urine culture.      Rx Macrobid 1 po BID x 7 days.      Thanks!      VL

## 2012-10-02 ENCOUNTER — Other Ambulatory Visit: Payer: Medicaid Other

## 2012-10-03 ENCOUNTER — Other Ambulatory Visit (INDEPENDENT_AMBULATORY_CARE_PROVIDER_SITE_OTHER): Payer: Medicaid Other

## 2012-10-03 DIAGNOSIS — Z3009 Encounter for other general counseling and advice on contraception: Secondary | ICD-10-CM

## 2012-10-03 MED ORDER — MEDROXYPROGESTERONE ACETATE 150 MG/ML IM SUSP
150.0000 mg | Freq: Once | INTRAMUSCULAR | Status: AC
  Administered 2012-10-03: 150 mg via INTRAMUSCULAR

## 2012-10-03 NOTE — Progress Notes (Signed)
Next shot due 12/25/12

## 2013-05-27 ENCOUNTER — Encounter (HOSPITAL_COMMUNITY): Payer: Self-pay | Admitting: Emergency Medicine

## 2013-05-27 ENCOUNTER — Emergency Department (INDEPENDENT_AMBULATORY_CARE_PROVIDER_SITE_OTHER)
Admission: EM | Admit: 2013-05-27 | Discharge: 2013-05-27 | Disposition: A | Discharge status: Home / Self Care | Payer: Self-pay | Source: Home / Self Care

## 2013-05-27 DIAGNOSIS — J069 Acute upper respiratory infection, unspecified: Secondary | ICD-10-CM

## 2013-05-27 DIAGNOSIS — N390 Urinary tract infection, site not specified: Secondary | ICD-10-CM

## 2013-05-27 LAB — POCT URINALYSIS DIP (DEVICE)
Nitrite: NEGATIVE
Protein, ur: NEGATIVE mg/dL
pH: 7.5 (ref 5.0–8.0)

## 2013-05-27 MED ORDER — FLUCONAZOLE 150 MG PO TABS: 150.0000 mg | ORAL_TABLET | Freq: Once | ORAL | Status: DC

## 2013-05-27 MED ORDER — FLUTICASONE PROPIONATE 50 MCG/ACT NA SUSP: 2.0000 | Freq: Every day | NASAL | Status: DC

## 2013-05-27 MED ORDER — NITROFURANTOIN MONOHYD MACRO 100 MG PO CAPS: 100.0000 mg | ORAL_CAPSULE | Freq: Two times a day (BID) | ORAL | Status: AC

## 2013-05-27 MED ORDER — NITROFURANTOIN MONOHYD MACRO 100 MG PO CAPS: 100.0000 mg | ORAL_CAPSULE | Freq: Two times a day (BID) | ORAL | Status: DC

## 2013-05-27 NOTE — ED Notes (Signed)
Pt c/o abdominal pains and possible UTI. Has tried OTC pain reliever with no relief. Patient is alert and oriented.

## 2013-05-27 NOTE — ED Provider Notes (Signed)
History    CSN: [id number] Arrival date & time 05/27/13  1132  First MD Initiated Contact with Patient 05/27/13 1229     No chief complaint on file.  (Consider location/radiation/quality/duration/timing/severity/associated sxs/prior Treatment) Patient is a 32 y.o. female presenting with dysuria.  Dysuria  Patient reports 3 days of dysuria. The dysuria is associated with frequency,  urine odor and suprapubic pain. Patient denies urgency, hesitancy, vaginal discharge, fever, flank pain and nausea.   Patient also reports 4 days of intermittent nasal congestion associated with frontal headache. There is no associated fever. There is associated cough that is productive of yellow sputum and sore throat. Seem to be improving during the day but are worsening at night and early morning.  She is not a smoker and has no known sick contacts. 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   Past Medical History  Diagnosis Date  . Hyperemesis   . Anxiety   . Yeast infection   . Bacterial infection   . H/O varicella   . Abnormal Pap smear 2006  . Anemia 2002  . H/O chlamydia infection 2010  . History of GBS (group B streptococcus) UTI, currently pregnant 2002  . H/O candidiasis   . H/O cystitis 2012  . H/O pyelonephritis 2011   Past Surgical History  Procedure Laterality Date  . No past surgeries    . Induced abortion  2011   Family History  Problem Relation Age of Onset  . Anesthesia problems Neg Hx   . Mental illness Father   . Hypertension Maternal Aunt   . Diabetes Maternal Aunt   . Kidney disease Maternal Aunt   . Hypertension Maternal Uncle   . Kidney disease Maternal Uncle   . Alcohol abuse Maternal Uncle   . Heart disease Maternal Grandmother   . Hypertension Maternal Grandmother   . Diabetes Maternal Grandmother   . Kidney disease Maternal Grandmother   . Kidney disease Cousin    History  Substance Use Topics  . Smoking status: Never Smoker   .  Smokeless tobacco: Never Used  . Alcohol Use: No   OB History   Grav Para Term Preterm Abortions TAB SAB Ect Mult Living   4 2 2  0 2 0 1 0 0 2     Review of Systems  HENT: Positive for congestion, sore throat and rhinorrhea. Negative for ear pain and facial swelling.   Respiratory: Positive for cough. Negative for shortness of breath.   Cardiovascular: Negative for chest pain.  Genitourinary: Positive for dysuria.  Neurological: Positive for headaches.    Allergies  Latex  Home Medications   Current Outpatient Rx  Name  Route  Sig  Dispense  Refill  . cephALEXin (KEFLEX) 500 MG capsule   Oral   Take 1 capsule (500 mg total) by mouth 2 (two) times daily.   14 capsule   0   . ferrous sulfate 325 (65 FE) MG tablet   Oral   Take 1 tablet (325 mg total) by mouth 2 (two) times daily.   60 tablet   1   . EXPIRED: medroxyPROGESTERone (DEPO-PROVERA) 150 MG/ML injection   Intramuscular   Inject 1 mL (150 mg total) into the muscle every 3 (three) months.   1 mL   4   . Prenatal Vit-Fe Fumarate-FA (MULTIVITAMIN-PRENATAL) 27-0.8 MG TABS   Oral   Take 1 tablet by mouth daily.         Marland Kitchen senna-docusate (SENOKOT-S)  8.6-50 MG per tablet   Oral   Take 2 tablets by mouth at bedtime.   60 tablet   1    BP 125/83  Pulse 63  Temp(Src) 97.8 F (36.6 C) (Oral)  Resp 16  SpO2 100% Physical Exam  Constitutional: She appears well-developed and well-nourished.  HENT:  Head: Normocephalic.  Right Ear: Tympanic membrane, external ear and ear canal normal.  Left Ear: External ear normal.  Nose: Mucosal edema present. No rhinorrhea or sinus tenderness. Right sinus exhibits frontal sinus tenderness. Right sinus exhibits no maxillary sinus tenderness. Left sinus exhibits frontal sinus tenderness. Left sinus exhibits no maxillary sinus tenderness.  Mouth/Throat: Oropharyngeal exudate present. No posterior oropharyngeal edema, posterior oropharyngeal erythema or tonsillar abscesses.   Cerumen left canal.  Eyes: Conjunctivae are normal. Pupils are equal, round, and reactive to light.  Neck: Normal range of motion. Neck supple.  Cardiovascular: Normal rate and regular rhythm.   Pulmonary/Chest: Effort normal and breath sounds normal.    ED Course  Procedures (including critical care time) Labs Reviewed  POCT URINALYSIS DIP (DEVICE) - Abnormal; Notable for the following:    Hgb urine dipstick MODERATE (*)    Leukocytes, UA TRACE (*)    All other components within normal limits  URINALYSIS, DIPSTICK ONLY  POCT PREGNANCY, URINE   No results found. No diagnosis found.  MDM  Uncomplicated urinary tract infection. Macrobid 100 mg twice daily for 5 days, followed by diflucan x 1 dose to prevent vaginal candidiasis.   Viral upper respiratory infection.  Supportive care Tylenol and Motrin as needed for discomfort. Prescribed Flonase for nasal congestion.  Dessa Phi, MD 05/27/13 1331

## 2013-06-10 NOTE — ED Provider Notes (Signed)
Medical screening examination/treatment/procedure(s) were performed by resident physician or non-physician practitioner and as supervising physician I was immediately available for consultation/collaboration.   KINDL,JAMES DOUGLAS MD.   James D Kindl, MD 06/10/13 0843 

## 2013-06-15 IMAGING — US US OB COMP LESS 14 WK
1 series · 14 of 25 positions shown · non-contrast
Comparison: None.

CLINICAL DATA: Hyperemesis

OBSTETRIC <14 WK ULTRASOUND
TECHNIQUE: Transabdominal ultrasound was performed for evaluation
of the gestation as well as the maternal uterus and adnexal
regions.

[Series 1: us ob comp less 14 wks · 25 acquisitions, 14 frames shown]
[im 1/25]
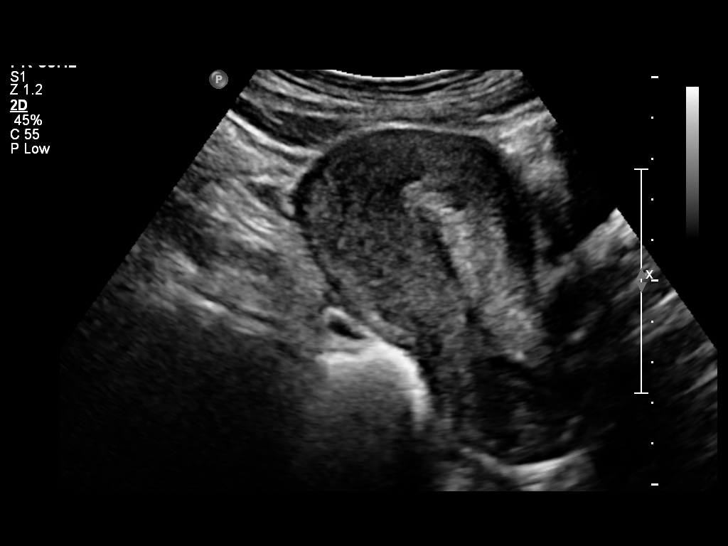
[im 3/25]
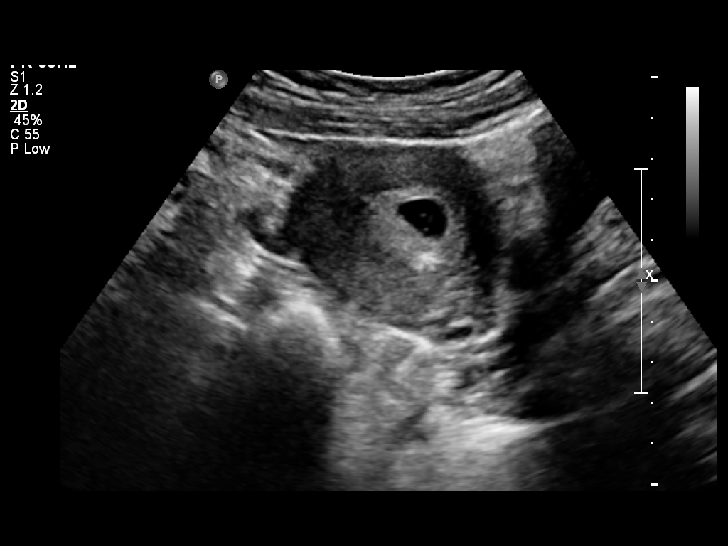
[im 5/25]
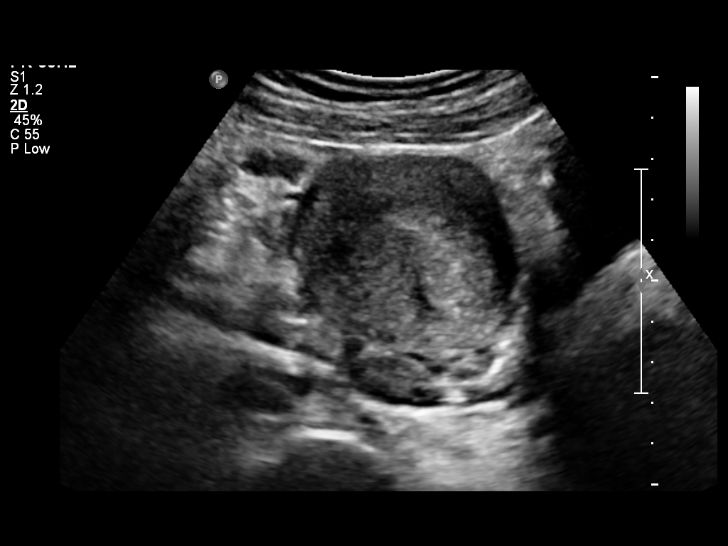
[im 7/25]
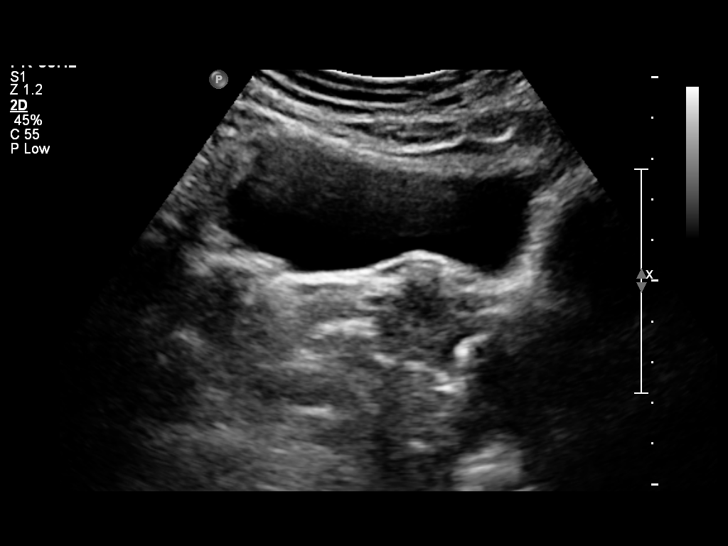
[im 9/25]
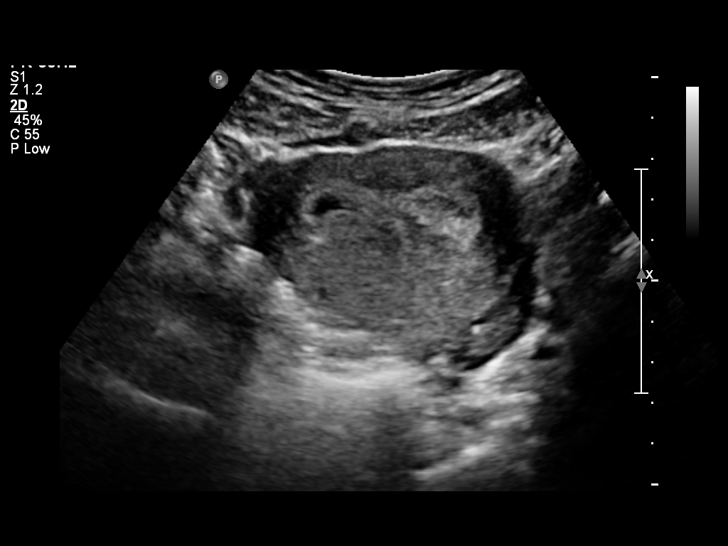
[im 10/25]
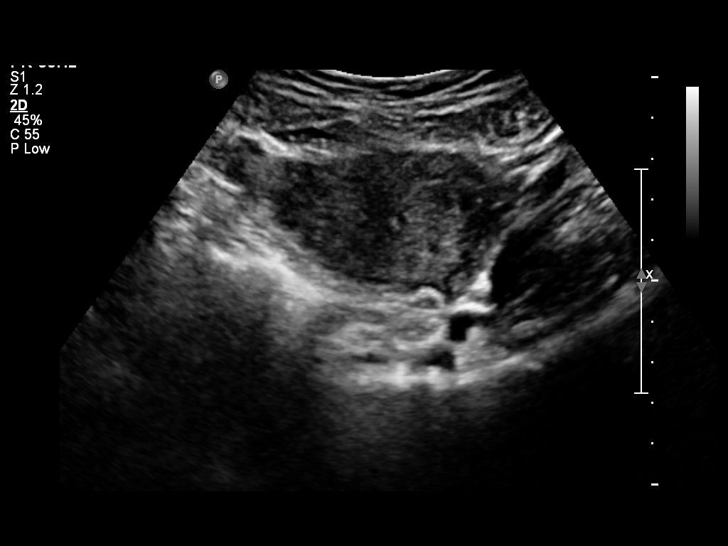
[im 12/25]
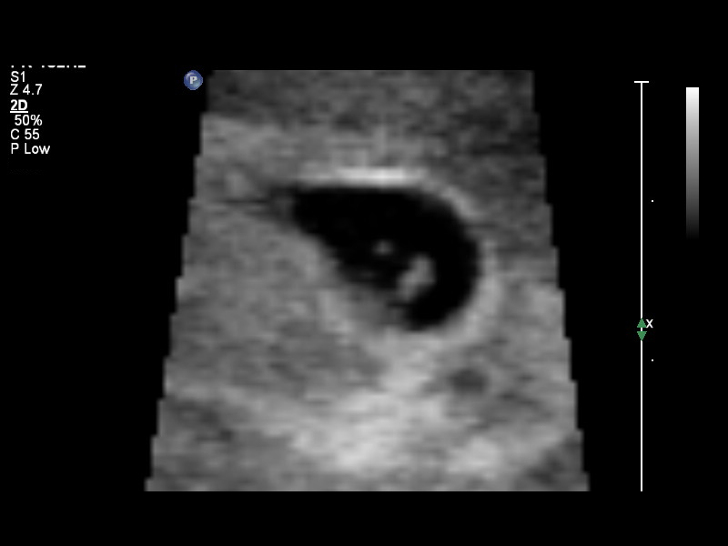
[im 14/25]
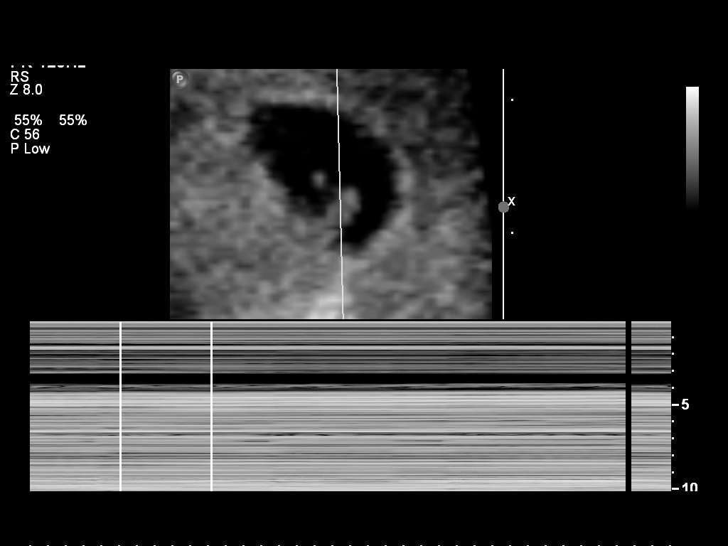
[im 16/25]
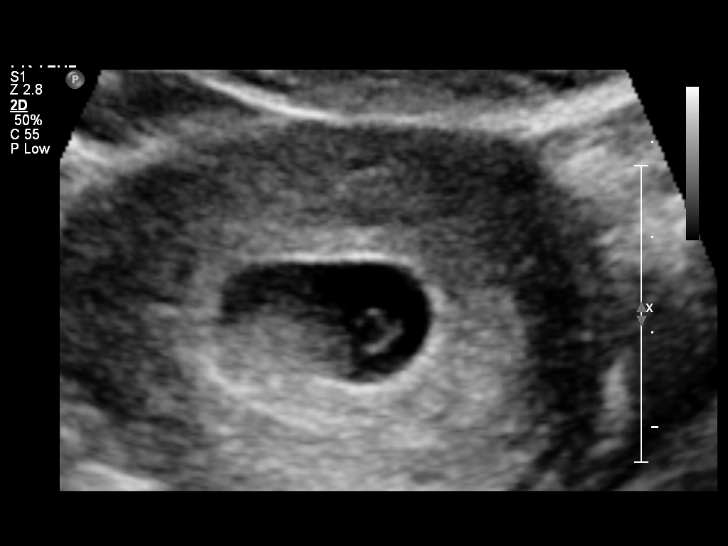
[im 17/25]
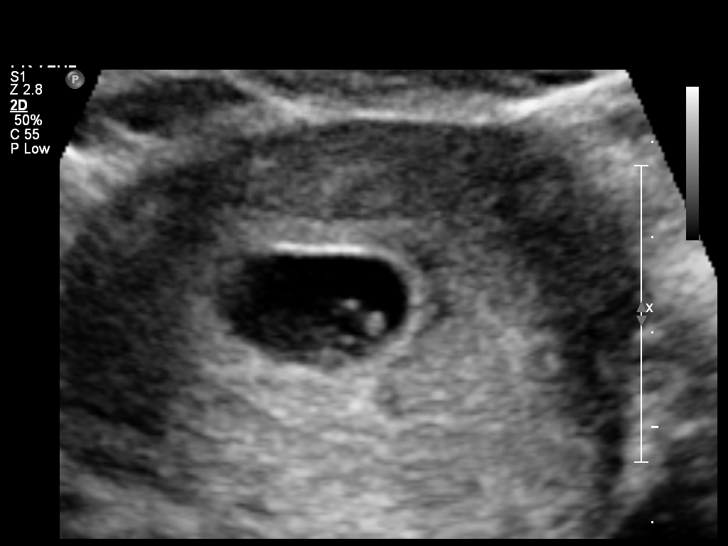
[im 19/25]
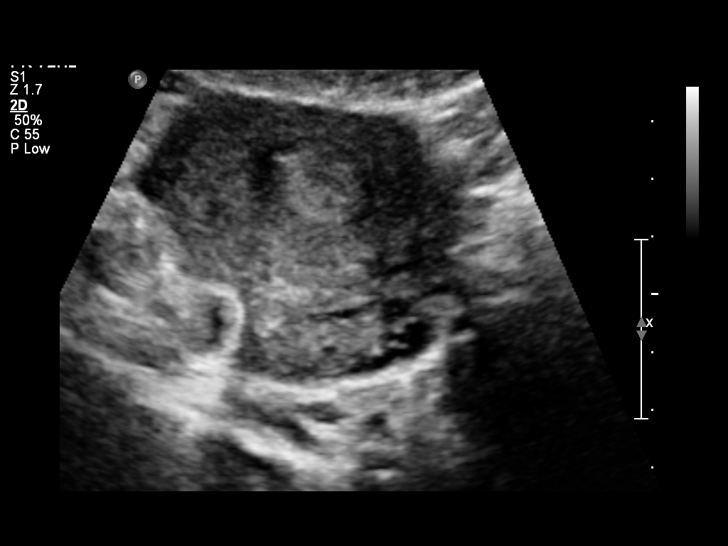
[im 21/25]
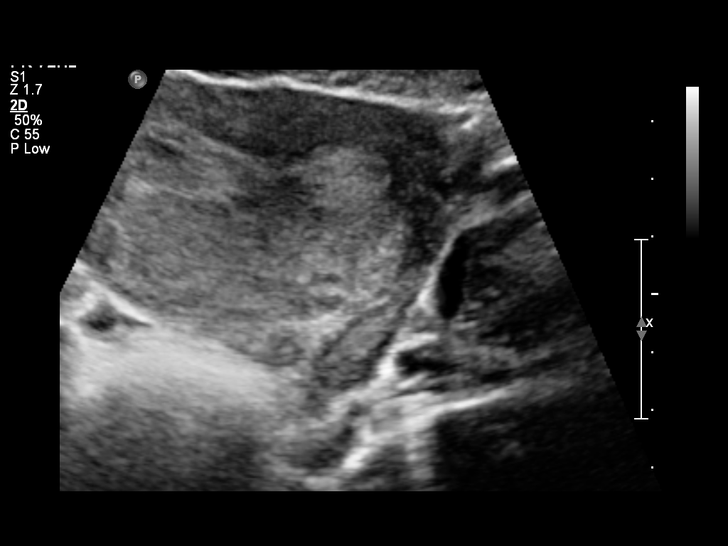
[im 23/25]
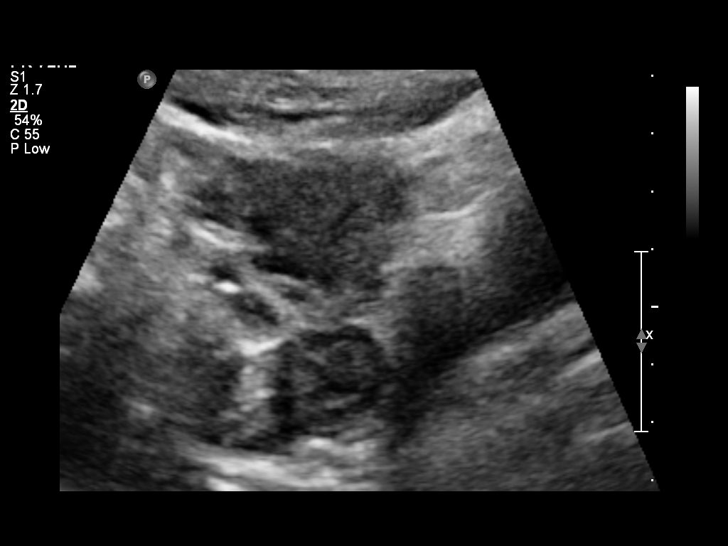
[im 25/25]
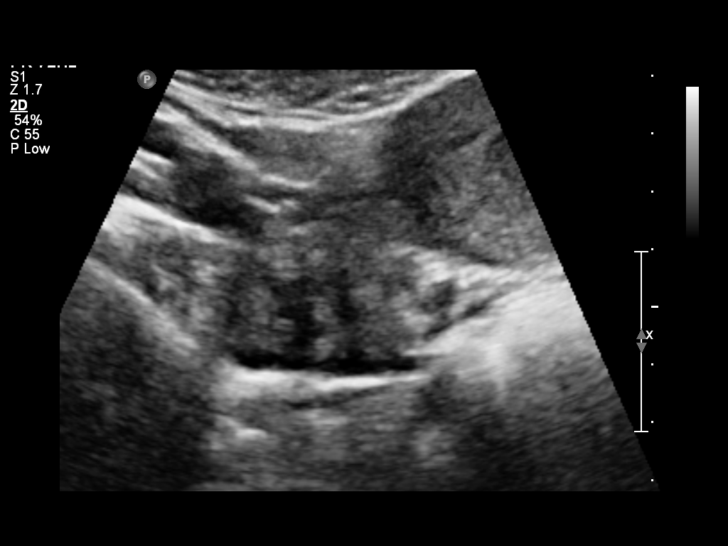

[14 of 25 positions shown; findings below may reference images not displayed]

Intrauterine gestational sac: Visualized/normal in shape.
Yolk sac: Present
Embryo: Present
Cardiac Activity: Present
Heart Rate: 117 bpm

CRL: 3.1 mm, corresponding to an estimated gestational age of 6
weeks 0 days
US EDC: 07/17/2012

Maternal uterus/Adnexae:
No subchorionic hemorrhage.

Left ovary is within normal limits, measuring 1.1 x 3.1 x 1.2 cm.

Right ovary is within normal limits, measuring 1.9 x 2.8 x 2.9 cm.

No free fluid.
IMPRESSION: Single live intrauterine gestation measuring 6 weeks 0 days by
crown rump length.

## 2013-09-09 ENCOUNTER — Encounter (HOSPITAL_COMMUNITY): Payer: Self-pay | Admitting: Emergency Medicine

## 2013-09-09 ENCOUNTER — Emergency Department (HOSPITAL_COMMUNITY)
Admission: EM | Admit: 2013-09-09 | Discharge: 2013-09-10 | Disposition: A | Discharge status: Home / Self Care | Payer: Self-pay | Attending: Emergency Medicine | Admitting: Emergency Medicine

## 2013-09-09 DIAGNOSIS — T148XXA Other injury of unspecified body region, initial encounter: Secondary | ICD-10-CM

## 2013-09-09 DIAGNOSIS — R110 Nausea: Secondary | ICD-10-CM | POA: Insufficient documentation

## 2013-09-09 DIAGNOSIS — R109 Unspecified abdominal pain: Secondary | ICD-10-CM | POA: Insufficient documentation

## 2013-09-09 DIAGNOSIS — Z8742 Personal history of other diseases of the female genital tract: Secondary | ICD-10-CM | POA: Insufficient documentation

## 2013-09-09 DIAGNOSIS — N390 Urinary tract infection, site not specified: Secondary | ICD-10-CM | POA: Insufficient documentation

## 2013-09-09 DIAGNOSIS — Z862 Personal history of diseases of the blood and blood-forming organs and certain disorders involving the immune mechanism: Secondary | ICD-10-CM | POA: Insufficient documentation

## 2013-09-09 DIAGNOSIS — Z3202 Encounter for pregnancy test, result negative: Secondary | ICD-10-CM | POA: Insufficient documentation

## 2013-09-09 DIAGNOSIS — Z8659 Personal history of other mental and behavioral disorders: Secondary | ICD-10-CM | POA: Insufficient documentation

## 2013-09-09 DIAGNOSIS — R51 Headache: Secondary | ICD-10-CM | POA: Insufficient documentation

## 2013-09-09 DIAGNOSIS — Z8619 Personal history of other infectious and parasitic diseases: Secondary | ICD-10-CM | POA: Insufficient documentation

## 2013-09-09 DIAGNOSIS — Z792 Long term (current) use of antibiotics: Secondary | ICD-10-CM | POA: Insufficient documentation

## 2013-09-09 DIAGNOSIS — M25559 Pain in unspecified hip: Secondary | ICD-10-CM | POA: Insufficient documentation

## 2013-09-09 DIAGNOSIS — Z91040 Latex allergy status: Secondary | ICD-10-CM | POA: Insufficient documentation

## 2013-09-09 NOTE — ED Notes (Signed)
Pt. reports dysuria for 2 days with slight headache,nausea and left upper thigh muscle ache. Ambulatory.

## 2013-09-10 LAB — URINE MICROSCOPIC-ADD ON

## 2013-09-10 LAB — URINALYSIS, ROUTINE W REFLEX MICROSCOPIC
Ketones, ur: NEGATIVE mg/dL
Nitrite: NEGATIVE
pH: 6 (ref 5.0–8.0)

## 2013-09-10 MED ORDER — CEPHALEXIN 500 MG PO CAPS: 1000.0000 mg | ORAL_CAPSULE | Freq: Two times a day (BID) | ORAL | Status: DC

## 2013-09-10 NOTE — ED Notes (Signed)
Pt dc to home. Pt sts understanding to dc instructions. Pt ambulatory to exit without difficulty.  Pt denies need for w/c.  

## 2013-09-10 NOTE — ED Provider Notes (Signed)
CSN: [id number]     Arrival date & time 09/09/13  2327 History   None    Chief Complaint  Patient presents with  . Dysuria   (Consider location/radiation/quality/duration/timing/severity/associated sxs/prior Treatment) HPI History provided by pt.   Pt presents w/ multiple complaints.  Has had random, intermittent,  Mild-mod L frontal headache for the past 2 days.  Associated w/ nausea.  Denies fever, photophobia, blurred vision, dizziness, extremity weakness/paresthesias, nasal congestion, rhinorrhea.  Denies trauma.  Also c/o dysuria x 4d.  Became associated w/ lower abd pain yesterday and tactile fever today.  Has not had flank pain, change in bowels, vaginal sx or other urinary sx.  Also c/o non-traumatic L proximal anterior thigh pain for the past few days.  Intermittent, mild, aching, no associated skin changes or edema.  Has not taken anything for any of her symptoms.  Past Medical History  Diagnosis Date  . Hyperemesis   . Anxiety   . Yeast infection   . Bacterial infection   . H/O varicella   . Abnormal Pap smear 2006  . Anemia 2002  . H/O chlamydia infection 2010  . History of GBS (group B streptococcus) UTI, currently pregnant 2002  . H/O candidiasis   . H/O cystitis 2012  . H/O pyelonephritis 2011   Past Surgical History  Procedure Laterality Date  . No past surgeries    . Induced abortion  2011   Family History  Problem Relation Age of Onset  . Anesthesia problems Neg Hx   . Mental illness Father   . Hypertension Maternal Aunt   . Diabetes Maternal Aunt   . Kidney disease Maternal Aunt   . Hypertension Maternal Uncle   . Kidney disease Maternal Uncle   . Alcohol abuse Maternal Uncle   . Heart disease Maternal Grandmother   . Hypertension Maternal Grandmother   . Diabetes Maternal Grandmother   . Kidney disease Maternal Grandmother   . Kidney disease Cousin    History  Substance Use Topics  . Smoking status: Never Smoker   . Smokeless tobacco: Never Used   . Alcohol Use: No   OB History   Grav Para Term Preterm Abortions TAB SAB Ect Mult Living   4 2 2  0 2 0 1 0 0 2     Review of Systems  All other systems reviewed and are negative.    Allergies  Latex  Home Medications   Current Outpatient Rx  Name  Route  Sig  Dispense  Refill  . cephALEXin (KEFLEX) 500 MG capsule   Oral   Take 2 capsules (1,000 mg total) by mouth 2 (two) times daily.   28 capsule   0    BP 125/85  Pulse 77  Temp(Src) 98.3 F (36.8 C) (Oral)  Resp 20  Wt 185 lb (83.915 kg)  BMI 32.78 kg/m2  SpO2 100%  LMP 08/13/2013 Physical Exam  Nursing note and vitals reviewed. Constitutional: She is oriented to person, place, and time. She appears well-developed and well-nourished. No distress.  HENT:  Head: Normocephalic and atraumatic.  Mild tenderness bilateral ethmoid and maxillary sinuses  Eyes:  Normal appearance  Neck: Normal range of motion.  No meningeal signs  Cardiovascular: Normal rate, regular rhythm and intact distal pulses.   Pulmonary/Chest: Effort normal and breath sounds normal.  Abdominal: Soft. Bowel sounds are normal. She exhibits no distension.  Mild tenderness mid-line lower abd only  Musculoskeletal: Normal range of motion.  LLE w/out edema or skin changes.  There is mild tenderness to deep palpation proximal 1/3rd of anterior thigh.  Minimal pain w/ passive ROM but pain w/ flexion and especially adduction of hip against resistance.    Neurological: She is alert and oriented to person, place, and time. No sensory deficit. Coordination normal.  CN 3-12 intact.  No nystagmus. 5/5 and equal upper and lower extremity strength.  No past pointing.     Skin: Skin is warm and dry. No rash noted.  Psychiatric: She has a normal mood and affect. Her behavior is normal.    ED Course  Procedures (including critical care time) Labs Review Labs Reviewed  URINALYSIS, ROUTINE W REFLEX MICROSCOPIC - Abnormal; Notable for the following:    Hgb  urine dipstick MODERATE (*)    Leukocytes, UA SMALL (*)    All other components within normal limits  URINE MICROSCOPIC-ADD ON - Abnormal; Notable for the following:    Squamous Epithelial / LPF FEW (*)    Bacteria, UA MANY (*)    All other components within normal limits  URINE CULTURE  POCT PREGNANCY, URINE   Imaging Review No results found.  MDM   1. UTI (lower urinary tract infection)   2. Headache   3. Muscle strain    32yo F presents w/ multiple complaints.  Has a non-traumatic frontal headache for the past few days.  On exam,well-appearing, afebrile, no focal neuro deficits or meningeal signs.  Also c/o non-traumatic L anterior thigh pain.  Pain aggravated by deep palpation and flexion/adduction of hip against resistance.  Suspect muscle strain.   Recommended warm compresses to thigh and ibuprofen for both headache and LLE pain.  Also c/o dysuria and suprapubic pain.  Tenderness localized to mid-line lower abd and abd otherwise benign today.  U/A pos for infection.  Low clinical suspicion for pyelo.  Pt prescribed keflex.  She has had nausea recently as well, that she believes is associated w/ headache.  Prescribed phenergan.  Return precautions including fever, worsening headache/abd pain, uncontrolled vomiting discussed.  She has a PCP.   Otilio Miu, PA-C 09/10/13 [phone]

## 2013-09-10 NOTE — ED Notes (Signed)
See nursing notes regarding pt.

## 2013-09-11 LAB — URINE CULTURE: Colony Count: >=100000

## 2013-09-11 NOTE — ED Provider Notes (Signed)
Medical screening examination/treatment/procedure(s) were performed by non-physician practitioner and as supervising physician I was immediately available for consultation/collaboration.   Lekisha Mcghee, MD 09/11/13 0719 

## 2014-01-20 ENCOUNTER — Emergency Department (INDEPENDENT_AMBULATORY_CARE_PROVIDER_SITE_OTHER)
Admission: EM | Admit: 2014-01-20 | Discharge: 2014-01-20 | Disposition: A | Discharge status: Home / Self Care | Payer: Self-pay | Source: Home / Self Care | Attending: Emergency Medicine | Admitting: Emergency Medicine

## 2014-01-20 ENCOUNTER — Encounter (HOSPITAL_COMMUNITY): Payer: Self-pay | Admitting: Emergency Medicine

## 2014-01-20 DIAGNOSIS — J069 Acute upper respiratory infection, unspecified: Secondary | ICD-10-CM

## 2014-01-20 DIAGNOSIS — N390 Urinary tract infection, site not specified: Secondary | ICD-10-CM

## 2014-01-20 LAB — POCT URINALYSIS DIP (DEVICE)
BILIRUBIN URINE: NEGATIVE
GLUCOSE, UA: NEGATIVE mg/dL
KETONES UR: NEGATIVE mg/dL
Nitrite: POSITIVE — AB
Protein, ur: NEGATIVE mg/dL
SPECIFIC GRAVITY, URINE: 1.015 (ref 1.005–1.030)
Urobilinogen, UA: 1 mg/dL (ref 0.0–1.0)
pH: 7.5 (ref 5.0–8.0)

## 2014-01-20 MED ORDER — NITROFURANTOIN MONOHYD MACRO 100 MG PO CAPS: 100.0000 mg | ORAL_CAPSULE | Freq: Two times a day (BID) | ORAL | Status: DC

## 2014-01-20 MED ORDER — IPRATROPIUM BROMIDE 0.06 % NA SOLN: 2.0000 | Freq: Four times a day (QID) | NASAL | Status: DC

## 2014-01-20 NOTE — ED Notes (Signed)
Uti: patient reports low grade temperature, pain with urination, denies vaginal discharge.   Uri: patient reports cough, sneezing, headache, head and chest congestion, blood tinged phlegm/sinus drainage

## 2014-01-20 NOTE — ED Provider Notes (Signed)
CSN: [id number]     Arrival date & time 01/20/14  1920 History   First MD Initiated Contact with Patient 01/20/14 2050     Chief Complaint  Patient presents with  . Urinary Tract Infection  . URI     (Consider location/radiation/quality/duration/timing/severity/associated sxs/prior Treatment) HPI Comments: Patient presents with 2-3 days of cough, sneezing, headache, chest congestion, rhinorrhea and post nasal drainage without associated fever or sore throat.  Also wishes to reports 4 days of dysuria and mild urinary frequency without hematuria, vaginal bleeding/discharge, flank pain or abdominal pain. No N/V/D.   The history is provided by the patient.    Past Medical History  Diagnosis Date  . Hyperemesis   . Anxiety   . Yeast infection   . Bacterial infection   . H/O varicella   . Abnormal Pap smear 2006  . Anemia 2002  . H/O chlamydia infection 2010  . History of GBS (group B streptococcus) UTI, currently pregnant 2002  . H/O candidiasis   . H/O cystitis 2012  . H/O pyelonephritis 2011   Past Surgical History  Procedure Laterality Date  . No past surgeries    . Induced abortion  2011   Family History  Problem Relation Age of Onset  . Anesthesia problems Neg Hx   . Mental illness Father   . Hypertension Maternal Aunt   . Diabetes Maternal Aunt   . Kidney disease Maternal Aunt   . Hypertension Maternal Uncle   . Kidney disease Maternal Uncle   . Alcohol abuse Maternal Uncle   . Heart disease Maternal Grandmother   . Hypertension Maternal Grandmother   . Diabetes Maternal Grandmother   . Kidney disease Maternal Grandmother   . Kidney disease Cousin    History  Substance Use Topics  . Smoking status: Never Smoker   . Smokeless tobacco: Never Used  . Alcohol Use: No   OB History   Grav Para Term Preterm Abortions TAB SAB Ect Mult Living   4 2 2  0 2 0 1 0 0 2     Review of Systems  All other systems reviewed and are negative.      Allergies   Latex  Home Medications   Current Outpatient Rx  Name  Route  Sig  Dispense  Refill  . acetaminophen (TYLENOL) 325 MG tablet   Oral   Take 650 mg by mouth every 6 (six) hours as needed.         . Pseudoeph-Doxylamine-DM-APAP (NYQUIL PO)   Oral   Take by mouth.         . cephALEXin (KEFLEX) 500 MG capsule   Oral   Take 2 capsules (1,000 mg total) by mouth 2 (two) times daily.   28 capsule   0    BP 126/91  Pulse 81  Temp(Src) 98.3 F (36.8 C) (Oral)  Resp 16  SpO2 100%  LMP 01/10/2014 Physical Exam  Nursing note and vitals reviewed. Constitutional: She is oriented to person, place, and time. She appears well-developed and well-nourished. No distress.  HENT:  Head: Normocephalic and atraumatic.  Right Ear: Hearing, tympanic membrane, external ear and ear canal normal.  Left Ear: Hearing, tympanic membrane, external ear and ear canal normal.  Nose: Nose normal. Right sinus exhibits no maxillary sinus tenderness and no frontal sinus tenderness. Left sinus exhibits no maxillary sinus tenderness and no frontal sinus tenderness.  Mouth/Throat: Uvula is midline, oropharynx is clear and moist and mucous membranes are normal.  Eyes: Conjunctivae are  normal. Right eye exhibits no discharge. Left eye exhibits no discharge. No scleral icterus.  Neck: Normal range of motion. Neck supple.  Cardiovascular: Normal rate, regular rhythm and normal heart sounds.   Pulmonary/Chest: Breath sounds normal. She is in respiratory distress. She has no wheezes.  Abdominal: Soft. Bowel sounds are normal. She exhibits no distension. There is no tenderness. There is no CVA tenderness.  Musculoskeletal: Normal range of motion.  Lymphadenopathy:    She has no cervical adenopathy.  Neurological: She is alert and oriented to person, place, and time.  Skin: Skin is warm and dry. No rash noted.  Psychiatric: She has a normal mood and affect. Her behavior is normal.    ED Course  Procedures  (including critical care time) Labs Review Labs Reviewed - No data to display Imaging Review No results found.    MDM   Final diagnoses:  None  URI: advised symptomatic care at home and will provide Rx for atrovent nasal spray for nasal congestion UTI: UA consistent with UTI. Will initiate treatment with macrobid and send specimen for C&S. Advised re-evaluation of symptoms do not improve or fever, flank pain, N/V.    Jess BartersJennifer Lee [place], [place] 01/20/14 2150

## 2014-01-21 NOTE — ED Provider Notes (Signed)
Medical screening examination/treatment/procedure(s) were performed by non-physician practitioner and as supervising physician I was immediately available for consultation/collaboration.  Leslee Homeavid Sybilla Malhotra, M.D.  Reuben Likesavid C Syriana Croslin, MD 01/21/14 [phone]

## 2014-01-22 LAB — URINE CULTURE: SPECIAL REQUESTS: NORMAL

## 2014-01-22 NOTE — ED Notes (Signed)
Urine culture: >100,000 colonies E. Coli.  Pt. adequately treated with Macrobid. Vassie MoselleYork, Shabre Kreher M 01/22/2014

## 2014-05-30 ENCOUNTER — Emergency Department (HOSPITAL_COMMUNITY)
Admission: EM | Admit: 2014-05-30 | Discharge: 2014-05-30 | Disposition: A | Discharge status: Home / Self Care | Payer: Self-pay | Source: Home / Self Care | Attending: Emergency Medicine | Admitting: Emergency Medicine

## 2014-05-30 ENCOUNTER — Encounter (HOSPITAL_COMMUNITY): Payer: Self-pay | Admitting: Emergency Medicine

## 2014-05-30 DIAGNOSIS — N390 Urinary tract infection, site not specified: Secondary | ICD-10-CM

## 2014-05-30 LAB — POCT URINALYSIS DIP (DEVICE)
BILIRUBIN URINE: NEGATIVE
Glucose, UA: NEGATIVE mg/dL
Ketones, ur: NEGATIVE mg/dL
NITRITE: NEGATIVE
PH: 5.5 (ref 5.0–8.0)
PROTEIN: NEGATIVE mg/dL
Specific Gravity, Urine: 1.015 (ref 1.005–1.030)
UROBILINOGEN UA: 1 mg/dL (ref 0.0–1.0)

## 2014-05-30 LAB — POCT PREGNANCY, URINE: Preg Test, Ur: NEGATIVE

## 2014-05-30 MED ORDER — NITROFURANTOIN MONOHYD MACRO 100 MG PO CAPS: 100.0000 mg | ORAL_CAPSULE | Freq: Two times a day (BID) | ORAL | Status: DC

## 2014-05-30 NOTE — ED Provider Notes (Signed)
CSN: [id number]     Arrival date & time 05/30/14  1400 History   First MD Initiated Contact with Patient 05/30/14 1421     No chief complaint on file.  (Consider location/radiation/quality/duration/timing/severity/associated sxs/prior Treatment) Patient is a 33 y.o. female presenting with dysuria. The history is provided by the patient.  Dysuria Pain quality:  Burning Pain severity:  Mild Onset quality:  Gradual Duration:  3 days Timing:  Intermittent Chronicity:  New Recent urinary tract infections: yes (last UTI in 01/2014)   Urinary symptoms: discolored urine, frequent urination and hematuria   Associated symptoms: no abdominal pain, no fever, no flank pain, no genital lesions, no nausea, no vaginal discharge and no vomiting   Associated symptoms comment:  +reports generalized malaise and headache   Past Medical History  Diagnosis Date  . Hyperemesis   . Anxiety   . Yeast infection   . Bacterial infection   . H/O varicella   . Abnormal Pap smear 2006  . Anemia 2002  . H/O chlamydia infection 2010  . History of GBS (group B streptococcus) UTI, currently pregnant 2002  . H/O candidiasis   . H/O cystitis 2012  . H/O pyelonephritis 2011   Past Surgical History  Procedure Laterality Date  . No past surgeries    . Induced abortion  2011   Family History  Problem Relation Age of Onset  . Anesthesia problems Neg Hx   . Mental illness Father   . Hypertension Maternal Aunt   . Diabetes Maternal Aunt   . Kidney disease Maternal Aunt   . Hypertension Maternal Uncle   . Kidney disease Maternal Uncle   . Alcohol abuse Maternal Uncle   . Heart disease Maternal Grandmother   . Hypertension Maternal Grandmother   . Diabetes Maternal Grandmother   . Kidney disease Maternal Grandmother   . Kidney disease Cousin    History  Substance Use Topics  . Smoking status: Never Smoker   . Smokeless tobacco: Never Used  . Alcohol Use: No   OB History   Grav Para Term Preterm  Abortions TAB SAB Ect Mult Living   4 2 2  0 2 0 1 0 0 2     Review of Systems  Constitutional: Negative for fever and chills.  Respiratory: Negative.   Cardiovascular: Negative.   Gastrointestinal: Negative for nausea, vomiting, abdominal pain, diarrhea, constipation and abdominal distention.  Genitourinary: Positive for dysuria and hematuria. Negative for frequency, flank pain, vaginal bleeding, vaginal discharge, vaginal pain and pelvic pain.  Musculoskeletal: Negative for back pain.  Skin: Negative.   Allergic/Immunologic: Negative for immunocompromised state.  Neurological: Positive for headaches. Negative for dizziness, syncope and weakness.    Allergies  Latex  Home Medications   Prior to Admission medications   Medication Sig Start Date End Date Taking? Authorizing Provider  acetaminophen (TYLENOL) 325 MG tablet Take 650 mg by mouth every 6 (six) hours as needed.    Historical Provider, MD  cephALEXin (KEFLEX) 500 MG capsule Take 2 capsules (1,000 mg total) by mouth 2 (two) times daily. 09/10/13   Arie Sabinaatherine E Schinlever, PA-C  ipratropium (ATROVENT) 0.06 % nasal spray Place 2 sprays into both nostrils 4 (four) times daily. 01/20/14   Ardis RowanJennifer Lee Gabbrielle Mcnicholas, PA  nitrofurantoin, macrocrystal-monohydrate, (MACROBID) 100 MG capsule Take 1 capsule (100 mg total) by mouth 2 (two) times daily. X 7 days 01/20/14   Ardis RowanJennifer Lee Allan Minotti, PA  nitrofurantoin, macrocrystal-monohydrate, (MACROBID) 100 MG capsule Take 1 capsule (100 mg total) by mouth  2 (two) times daily. 05/30/14   Ardis RowanJennifer Lee Keilany Burnette, PA  Pseudoeph-Doxylamine-DM-APAP (NYQUIL PO) Take by mouth.    Historical Provider, MD   BP 121/75  Pulse 86  Temp(Src) 98.3 F (36.8 C) (Oral)  Resp 16  SpO2 97% Physical Exam  Nursing note and vitals reviewed. Constitutional: She is oriented to person, place, and time. She appears well-developed and well-nourished.  HENT:  Head: Normocephalic and atraumatic.  Eyes: Conjunctivae are  normal. No scleral icterus.  Cardiovascular: Normal rate, regular rhythm and normal heart sounds.   Pulmonary/Chest: Effort normal and breath sounds normal.  Abdominal: Soft. Bowel sounds are normal. She exhibits no distension. There is no tenderness. There is no CVA tenderness.  Musculoskeletal: Normal range of motion.  Neurological: She is alert and oriented to person, place, and time.  Skin: Skin is warm and dry.  Psychiatric: She has a normal mood and affect. Her behavior is normal.    ED Course  Procedures (including critical care time) Labs Review Labs Reviewed  POCT URINALYSIS DIP (DEVICE) - Abnormal; Notable for the following:    Hgb urine dipstick LARGE (*)    Leukocytes, UA TRACE (*)    All other components within normal limits  URINE CULTURE  POCT PREGNANCY, URINE    Imaging Review No results found.   MDM   1. UTI (lower urinary tract infection)   Urine sent for C&S. Will treat with Macrobid. Strongly encouraged to locate PCP or ob/gyn for follow up.     Jess BartersJennifer Lee [place], [place] 05/30/14 1510

## 2014-05-30 NOTE — ED Notes (Signed)
Patient reports history of the same, has had symptoms for one week

## 2014-05-30 NOTE — Discharge Instructions (Signed)

## 2014-06-01 LAB — URINE CULTURE: SPECIAL REQUESTS: NORMAL

## 2014-06-02 NOTE — ED Notes (Signed)
Urine culture: >100,000 colonies E. Coli.  Pt. adequately treated with Macrobid. Vassie MoselleYork, Suzanne M 06/02/2014

## 2014-06-03 NOTE — ED Provider Notes (Signed)
Medical screening examination/treatment/procedure(s) were performed by non-physician practitioner and as supervising physician I was immediately available for consultation/collaboration.  Leslee Homeavid Yetzali Weld, M.D.  Reuben Likesavid C Amitai Delaughter, MD 06/03/14 [phone]

## 2014-10-05 ENCOUNTER — Encounter (HOSPITAL_COMMUNITY): Payer: Self-pay | Admitting: Emergency Medicine

## 2015-01-18 ENCOUNTER — Emergency Department (INDEPENDENT_AMBULATORY_CARE_PROVIDER_SITE_OTHER)
Admission: EM | Admit: 2015-01-18 | Discharge: 2015-01-18 | Disposition: A | Discharge status: Home / Self Care | Payer: Self-pay | Source: Home / Self Care | Attending: Family Medicine | Admitting: Family Medicine

## 2015-01-18 ENCOUNTER — Encounter (HOSPITAL_COMMUNITY): Payer: Self-pay | Admitting: *Deleted

## 2015-01-18 DIAGNOSIS — J40 Bronchitis, not specified as acute or chronic: Secondary | ICD-10-CM

## 2015-01-18 DIAGNOSIS — N390 Urinary tract infection, site not specified: Secondary | ICD-10-CM

## 2015-01-18 LAB — POCT URINALYSIS DIP (DEVICE)
BILIRUBIN URINE: NEGATIVE
GLUCOSE, UA: NEGATIVE mg/dL
KETONES UR: NEGATIVE mg/dL
NITRITE: POSITIVE — AB
Protein, ur: NEGATIVE mg/dL
Specific Gravity, Urine: 1.015 (ref 1.005–1.030)
Urobilinogen, UA: 1 mg/dL (ref 0.0–1.0)
pH: 6.5 (ref 5.0–8.0)

## 2015-01-18 LAB — POCT PREGNANCY, URINE: Preg Test, Ur: NEGATIVE

## 2015-01-18 MED ORDER — CEPHALEXIN 500 MG PO CAPS: 500.0000 mg | ORAL_CAPSULE | Freq: Two times a day (BID) | ORAL | Status: DC

## 2015-01-18 MED ORDER — IPRATROPIUM-ALBUTEROL 0.5-2.5 (3) MG/3ML IN SOLN
3.0000 mL | Freq: Once | RESPIRATORY_TRACT | Status: AC
  Administered 2015-01-18: 3 mL via RESPIRATORY_TRACT

## 2015-01-18 MED ORDER — IPRATROPIUM-ALBUTEROL 0.5-2.5 (3) MG/3ML IN SOLN
RESPIRATORY_TRACT | Status: AC
  Filled 2015-01-18: qty 3

## 2015-01-18 MED ORDER — PREDNISONE 10 MG PO TABS: 30.0000 mg | ORAL_TABLET | Freq: Every day | ORAL | Status: DC

## 2015-01-18 NOTE — Discharge Instructions (Signed)
Thank you for coming in today. °Call or go to the emergency room if you get worse, have trouble breathing, have chest pains, or palpitations.  ° °Acute Bronchitis °Bronchitis is inflammation of the airways that extend from the windpipe into the lungs (bronchi). The inflammation often causes mucus to develop. This leads to a cough, which is the most common symptom of bronchitis.  °In acute bronchitis, the condition usually develops suddenly and goes away over time, usually in a couple weeks. Smoking, allergies, and asthma can make bronchitis worse. Repeated episodes of bronchitis may cause further lung problems.  °CAUSES °Acute bronchitis is most often caused by the same virus that causes a cold. The virus can spread from person to person (contagious) through coughing, sneezing, and touching contaminated objects. °SIGNS AND SYMPTOMS  °· Cough.   °· Fever.   °· Coughing up mucus.   °· Body aches.   °· Chest congestion.   °· Chills.   °· Shortness of breath.   °· Sore throat.   °DIAGNOSIS  °Acute bronchitis is usually diagnosed through a physical exam. Your health care provider will also ask you questions about your medical history. Tests, such as chest X-rays, are sometimes done to rule out other conditions.  °TREATMENT  °Acute bronchitis usually goes away in a couple weeks. Oftentimes, no medical treatment is necessary. Medicines are sometimes given for relief of fever or cough. Antibiotic medicines are usually not needed but may be prescribed in certain situations. In some cases, an inhaler may be recommended to help reduce shortness of breath and control the cough. A cool mist vaporizer may also be used to help thin bronchial secretions and make it easier to clear the chest.  °HOME CARE INSTRUCTIONS °· Get plenty of rest.   °· Drink enough fluids to keep your urine clear or pale yellow (unless you have a medical condition that requires fluid restriction). Increasing fluids may help thin your respiratory secretions  (sputum) and reduce chest congestion, and it will prevent dehydration.   °· Take medicines only as directed by your health care provider. °· If you were prescribed an antibiotic medicine, finish it all even if you start to feel better. °· Avoid smoking and secondhand smoke. Exposure to cigarette smoke or irritating chemicals will make bronchitis worse. If you are a smoker, consider using nicotine gum or skin patches to help control withdrawal symptoms. Quitting smoking will help your lungs heal faster.   °· Reduce the chances of another bout of acute bronchitis by washing your hands frequently, avoiding people with cold symptoms, and trying not to touch your hands to your mouth, nose, or eyes.   °· Keep all follow-up visits as directed by your health care provider.   °SEEK MEDICAL CARE IF: °Your symptoms do not improve after 1 week of treatment.  °SEEK IMMEDIATE MEDICAL CARE IF: °· You develop an increased fever or chills.   °· You have chest pain.   °· You have severe shortness of breath. °· You have bloody sputum.   °· You develop dehydration. °· You faint or repeatedly feel like you are going to pass out. °· You develop repeated vomiting. °· You develop a severe headache. °MAKE SURE YOU:  °· Understand these instructions. °· Will watch your condition. °· Will get help right away if you are not doing well or get worse. °Document Released: 12/28/2004 Document Revised: 04/06/2014 Document Reviewed: 05/13/2013 °ExitCare® Patient Information ©2015 [employer]. This information is not intended to replace advice given to you by your health care provider. Make sure you discuss any questions you have with your   health care provider. ° ° °Urinary Tract Infection °Urinary tract infections (UTIs) can develop anywhere along your urinary tract. Your urinary tract is your body's drainage system for removing wastes and extra water. Your urinary tract includes two kidneys, two ureters, a bladder, and a urethra. Your kidneys are  a pair of bean-shaped organs. Each kidney is about the size of your fist. They are located below your ribs, one on each side of your spine. °CAUSES °Infections are caused by microbes, which are microscopic organisms, including fungi, viruses, and bacteria. These organisms are so small that they can only be seen through a microscope. Bacteria are the microbes that most commonly cause UTIs. °SYMPTOMS  °Symptoms of UTIs may vary by age and gender of the patient and by the location of the infection. Symptoms in young women typically include a frequent and intense urge to urinate and a painful, burning feeling in the bladder or urethra during urination. Older women and men are more likely to be tired, shaky, and weak and have muscle aches and abdominal pain. A fever may mean the infection is in your kidneys. Other symptoms of a kidney infection include pain in your back or sides below the ribs, nausea, and vomiting. °DIAGNOSIS °To diagnose a UTI, your caregiver will ask you about your symptoms. Your caregiver also will ask to provide a urine sample. The urine sample will be tested for bacteria and white blood cells. White blood cells are made by your body to help fight infection. °TREATMENT  °Typically, UTIs can be treated with medication. Because most UTIs are caused by a bacterial infection, they usually can be treated with the use of antibiotics. The choice of antibiotic and length of treatment depend on your symptoms and the type of bacteria causing your infection. °HOME CARE INSTRUCTIONS °· If you were prescribed antibiotics, take them exactly as your caregiver instructs you. Finish the medication even if you feel better after you have only taken some of the medication. °· Drink enough water and fluids to keep your urine clear or pale yellow. °· Avoid caffeine, tea, and carbonated beverages. They tend to irritate your bladder. °· Empty your bladder often. Avoid holding urine for long periods of time. °· Empty your  bladder before and after sexual intercourse. °· After a bowel movement, women should cleanse from front to back. Use each tissue only once. °SEEK MEDICAL CARE IF:  °· You have back pain. °· You develop a fever. °· Your symptoms do not begin to resolve within 3 days. °SEEK IMMEDIATE MEDICAL CARE IF:  °· You have severe back pain or lower abdominal pain. °· You develop chills. °· You have nausea or vomiting. °· You have continued burning or discomfort with urination. °MAKE SURE YOU:  °· Understand these instructions. °· Will watch your condition. °· Will get help right away if you are not doing well or get worse. °Document Released: 08/30/2005 Document Revised: 05/21/2012 Document Reviewed: 12/29/2011 °ExitCare® Patient Information ©2015 [employer]. This information is not intended to replace advice given to you by your health care provider. Make sure you discuss any questions you have with your health care provider. ° °

## 2015-01-18 NOTE — ED Provider Notes (Signed)
Frances FurbishShamekka D Plaugher is a 34 y.o. female who presents to Urgent Care today for urinary tract infection and shortness of breath. Patient is a 40 history of mild sore throat cough mild chest tightness and wheezing. She additionally notes urinary frequency urgency and dysuria. No vomiting or diarrhea. No abdominal pain or chest pain. Patient has tried DayQuil which helps.   Past Medical History  Diagnosis Date  . Hyperemesis   . Anxiety   . Yeast infection   . Bacterial infection   . H/O varicella   . Abnormal Pap smear 2006  . Anemia 2002  . H/O chlamydia infection 2010  . History of GBS (group B streptococcus) UTI, currently pregnant 2002  . H/O candidiasis   . H/O cystitis 2012  . H/O pyelonephritis 2011   Past Surgical History  Procedure Laterality Date  . No past surgeries    . Induced abortion  2011   History  Substance Use Topics  . Smoking status: Never Smoker   . Smokeless tobacco: Never Used  . Alcohol Use: No   ROS as above Medications: No current facility-administered medications for this encounter.   Current Outpatient Prescriptions  Medication Sig Dispense Refill  . acetaminophen (TYLENOL) 325 MG tablet Take 650 mg by mouth every 6 (six) hours as needed.    . cephALEXin (KEFLEX) 500 MG capsule Take 1 capsule (500 mg total) by mouth 2 (two) times daily. 14 capsule 0  . ipratropium (ATROVENT) 0.06 % nasal spray Place 2 sprays into both nostrils 4 (four) times daily. 15 mL 1  . predniSONE (DELTASONE) 10 MG tablet Take 3 tablets (30 mg total) by mouth daily. 15 tablet 0  . Pseudoeph-Doxylamine-DM-APAP (NYQUIL PO) Take by mouth.     Allergies  Allergen Reactions  . Latex Rash     Exam:  BP 113/76 mmHg  Pulse 83  Temp(Src) 98.9 F (37.2 C) (Oral)  Resp 16  Ht 5\' 3"  (1.6 m)  Wt 175 lb (79.379 kg)  BMI 31.01 kg/m2  SpO2 100%  LMP 01/07/2015 (Exact Date) Gen: Well NAD HEENT: EOMI,  MMM this or fax is normal-appearing. Normal tympanic membranes  bilaterally Lungs: Normal work of breathing. CTABL Heart: RRR no MRG Abd: NABS, Soft. Nondistended, Nontender no CVA tenderness to percussion Exts: Brisk capillary refill, warm and well perfused.   Patient was given a 2.5/0.5 mg DuoNeb nebulizer treatment and felt better  Results for orders placed or performed during the hospital encounter of 01/18/15 (from the past 24 hour(s))  POCT urinalysis dip (device)     Status: Abnormal   Collection Time: 01/18/15  4:25 PM  Result Value Ref Range   Glucose, UA NEGATIVE NEGATIVE mg/dL   Bilirubin Urine NEGATIVE NEGATIVE   Ketones, ur NEGATIVE NEGATIVE mg/dL   Specific Gravity, Urine 1.015 1.005 - 1.030   Hgb urine dipstick MODERATE (A) NEGATIVE   pH 6.5 5.0 - 8.0   Protein, ur NEGATIVE NEGATIVE mg/dL   Urobilinogen, UA 1.0 0.0 - 1.0 mg/dL   Nitrite POSITIVE (A) NEGATIVE   Leukocytes, UA TRACE (A) NEGATIVE  Pregnancy, urine POC     Status: None   Collection Time: 01/18/15  4:32 PM  Result Value Ref Range   Preg Test, Ur NEGATIVE NEGATIVE   No results found.  Assessment and Plan: 34 y.o. female with bronchitis and urinary tract infection. Treat with prednisone and Keflex.  Discussed warning signs or symptoms. Please see discharge instructions. Patient expresses understanding.     Rodolph BongEvan S Corey,  MD 01/18/15 1703

## 2015-01-18 NOTE — ED Notes (Signed)
Pt  Has  2   Complaints   She  Has  Symptoms  Of   sorethroat   -   Cough   As   Well     As   Congestion    For  3-4  Days      Pt  Also   rreports      Symptoms  Of  Urinary  Frequency  And  Lower  abd  Pain

## 2015-02-10 ENCOUNTER — Emergency Department (INDEPENDENT_AMBULATORY_CARE_PROVIDER_SITE_OTHER)
Admission: EM | Admit: 2015-02-10 | Discharge: 2015-02-10 | Disposition: A | Discharge status: Home / Self Care | Payer: 59 | Source: Home / Self Care | Attending: Family Medicine | Admitting: Family Medicine

## 2015-02-10 ENCOUNTER — Encounter (HOSPITAL_COMMUNITY): Payer: Self-pay | Admitting: Emergency Medicine

## 2015-02-10 DIAGNOSIS — B9789 Other viral agents as the cause of diseases classified elsewhere: Secondary | ICD-10-CM

## 2015-02-10 DIAGNOSIS — R061 Stridor: Secondary | ICD-10-CM

## 2015-02-10 DIAGNOSIS — J069 Acute upper respiratory infection, unspecified: Secondary | ICD-10-CM

## 2015-02-10 DIAGNOSIS — H6093 Unspecified otitis externa, bilateral: Secondary | ICD-10-CM

## 2015-02-10 MED ORDER — DEXAMETHASONE SODIUM PHOSPHATE 10 MG/ML IJ SOLN
10.0000 mg | Freq: Once | INTRAMUSCULAR | Status: AC
  Administered 2015-02-10: 10 mg via INTRAMUSCULAR

## 2015-02-10 MED ORDER — IPRATROPIUM BROMIDE 0.06 % NA SOLN: 2.0000 | Freq: Four times a day (QID) | NASAL | Status: DC

## 2015-02-10 MED ORDER — NEOMYCIN-POLYMYXIN-HC 3.5-10000-1 OT SOLN: OTIC | Status: DC

## 2015-02-10 MED ORDER — FLUTICASONE PROPIONATE 50 MCG/ACT NA SUSP: 2.0000 | Freq: Every day | NASAL | Status: DC

## 2015-02-10 NOTE — ED Notes (Signed)
Patient called w questions

## 2015-02-10 NOTE — ED Provider Notes (Signed)
CSN: [id number]     Arrival date & time 02/10/15  [id number] History   First MD Initiated Contact with Patient 02/10/15 0930     Chief Complaint  Patient presents with  . Shortness of Breath  . Otalgia   (Consider location/radiation/quality/duration/timing/severity/associated sxs/prior Treatment) HPI   01/18/15 treated for UTI and bronchitis w/ Keflex and Prednisone. Symptoms resolved after starting therapy. 7 days ago developed SOB, wheezing, and low energy. Occasional nausea and sore throat. Now w/ bilat ear pain L>R. Feels like she is "underwater" with regards to her ear pain and sensation. Q-tips without relief. OTC allergy medications w/ minimal benefit. Overall not improving.   Past Medical History  Diagnosis Date  . Hyperemesis   . Anxiety   . Yeast infection   . Bacterial infection   . H/O varicella   . Abnormal Pap smear 2006  . Anemia 2002  . H/O chlamydia infection 2010  . History of GBS (group B streptococcus) UTI, currently pregnant 2002  . H/O candidiasis   . H/O cystitis 2012  . H/O pyelonephritis 2011   Past Surgical History  Procedure Laterality Date  . No past surgeries    . Induced abortion  2011   Family History  Problem Relation Age of Onset  . Anesthesia problems Neg Hx   . Mental illness Father   . Hypertension Maternal Aunt   . Diabetes Maternal Aunt   . Kidney disease Maternal Aunt   . Hypertension Maternal Uncle   . Kidney disease Maternal Uncle   . Alcohol abuse Maternal Uncle   . Heart disease Maternal Grandmother   . Hypertension Maternal Grandmother   . Diabetes Maternal Grandmother   . Kidney disease Maternal Grandmother   . Kidney disease Cousin    History  Substance Use Topics  . Smoking status: Never Smoker   . Smokeless tobacco: Never Used  . Alcohol Use: No   OB History    Gravida Para Term Preterm AB TAB SAB Ectopic Multiple Living   4 2 2  0 2 0 1 0 0 2     Review of Systems Per HPI with all other pertinent systems negative.    Allergies  Latex  Home Medications   Prior to Admission medications   Medication Sig Start Date End Date Taking? Authorizing Provider  acetaminophen (TYLENOL) 325 MG tablet Take 650 mg by mouth every 6 (six) hours as needed.    Historical Provider, MD  fluticasone (FLONASE) 50 MCG/ACT nasal spray Place 2 sprays into both nostrils at bedtime. 02/10/15   Ozella Rocksavid J Merrell, MD  ipratropium (ATROVENT) 0.06 % nasal spray Place 2 sprays into both nostrils 4 (four) times daily. 02/10/15   Ozella Rocksavid J Merrell, MD  neomycin-polymyxin-hydrocortisone (CORTISPORIN) otic solution 3-4 drops each ear for 5-7 days 02/10/15   Ozella Rocksavid J Merrell, MD  Pseudoeph-Doxylamine-DM-APAP (NYQUIL PO) Take by mouth.    Historical Provider, MD   LMP 02/05/2015 Physical Exam  Constitutional: She is oriented to person, place, and time. She appears well-developed and well-nourished. No distress.  HENT:  Right and left TMs with injection and surrounding external ear canal erythema and minimal skin disruption.  Eyes: EOM are normal. Pupils are equal, round, and reactive to light.  Neck: Normal range of motion.  Cardiovascular: Normal rate, normal heart sounds and intact distal pulses.   No murmur heard. Pulmonary/Chest: Effort normal. No respiratory distress. She has no wheezes. She has no rales. She exhibits no tenderness.  Stridor  Abdominal: Soft. Bowel sounds are  normal. She exhibits no distension.  Musculoskeletal: Normal range of motion. She exhibits no edema or tenderness.  Neurological: She is alert and oriented to person, place, and time.  Skin: Skin is warm. She is not diaphoretic.  Psychiatric: She has a normal mood and affect. Her behavior is normal. Judgment and thought content normal.    ED Course  Procedures (including critical care time) Labs Review Labs Reviewed - No data to display  Imaging Review No results found.   MDM   1. Stridor   2. Otitis externa, bilateral   3. Viral URI with cough     Decadron 10 mg IM  Start Cortisporin for otitis externa bilateral. Nasal Atrovent, Flonase, daily allergy pill, and ibuprofen. No sign of sinusitis, respiratory distress ,pneumonia or bronchospasm/asthma.   Precautions given and all questions answered  Shelly Flatten, MD Family Medicine 02/10/2015, 9:47 AM       Ozella Rocks, MD 02/10/15 [phone]

## 2015-02-10 NOTE — Discharge Instructions (Signed)
You have an external ear canal infection that will require antibiotic drops You also have a viral illness causing nasal congestion, post nasal drip and tightening of your airway in your neck. Your given a steroid shot today which will significantly improve this. Please start using the nasal Atrovent, Flonase, I daily allergy pill such as Zyrtec, and ibuprofen as needed for relief.

## 2015-02-10 NOTE — ED Notes (Signed)
Pt c/o persistent SOB and has now developed bilateral ear pain Seen here last week; given keflex and steroid Alert, no signs of acute distress.

## 2015-04-30 ENCOUNTER — Emergency Department (INDEPENDENT_AMBULATORY_CARE_PROVIDER_SITE_OTHER)
Admission: EM | Admit: 2015-04-30 | Discharge: 2015-04-30 | Disposition: A | Discharge status: Home / Self Care | Payer: 59 | Source: Home / Self Care | Attending: Family Medicine | Admitting: Family Medicine

## 2015-04-30 ENCOUNTER — Encounter (HOSPITAL_COMMUNITY): Payer: Self-pay | Admitting: *Deleted

## 2015-04-30 DIAGNOSIS — N390 Urinary tract infection, site not specified: Secondary | ICD-10-CM | POA: Diagnosis not present

## 2015-04-30 LAB — POCT URINALYSIS DIP (DEVICE)
Bilirubin Urine: NEGATIVE
Glucose, UA: NEGATIVE mg/dL
Ketones, ur: NEGATIVE mg/dL
Nitrite: POSITIVE — AB
PH: 5.5 (ref 5.0–8.0)
Protein, ur: 100 mg/dL — AB
Specific Gravity, Urine: 1.02 (ref 1.005–1.030)
UROBILINOGEN UA: 0.2 mg/dL (ref 0.0–1.0)

## 2015-04-30 LAB — POCT PREGNANCY, URINE: Preg Test, Ur: NEGATIVE

## 2015-04-30 MED ORDER — CEFTRIAXONE SODIUM 1 G IJ SOLR
1.0000 g | Freq: Once | INTRAMUSCULAR | Status: AC
  Administered 2015-04-30: 1 g via INTRAMUSCULAR

## 2015-04-30 MED ORDER — CEPHALEXIN 500 MG PO CAPS: 500.0000 mg | ORAL_CAPSULE | Freq: Four times a day (QID) | ORAL | Status: DC

## 2015-04-30 MED ORDER — CEFTRIAXONE SODIUM 1 G IJ SOLR
INTRAMUSCULAR | Status: AC
  Filled 2015-04-30: qty 10

## 2015-04-30 NOTE — Discharge Instructions (Signed)
Take all of medicine as directed, drink lots of fluids, see your doctor if further problems. Start medicine on sat.

## 2015-04-30 NOTE — ED Provider Notes (Signed)
CSN: [id number]     Arrival date & time 04/30/15  [id number] History   First MD Initiated Contact with Patient 04/30/15 [phone]     Chief Complaint  Patient presents with  . Urinary Frequency   (Consider location/radiation/quality/duration/timing/severity/associated sxs/prior Treatment) Patient is a 34 y.o. female presenting with frequency. The history is provided by the patient.  Urinary Frequency This is a new problem. The current episode started more than 2 days ago. Associated symptoms include abdominal pain. Pertinent negatives include no chest pain and no shortness of breath.    Past Medical History  Diagnosis Date  . Hyperemesis   . Anxiety   . Yeast infection   . Bacterial infection   . H/O varicella   . Abnormal Pap smear 2006  . Anemia 2002  . H/O chlamydia infection 2010  . History of GBS (group B streptococcus) UTI, currently pregnant 2002  . H/O candidiasis   . H/O cystitis 2012  . H/O pyelonephritis 2011   Past Surgical History  Procedure Laterality Date  . No past surgeries    . Induced abortion  2011   Family History  Problem Relation Age of Onset  . Anesthesia problems Neg Hx   . Mental illness Father   . Hypertension Maternal Aunt   . Diabetes Maternal Aunt   . Kidney disease Maternal Aunt   . Hypertension Maternal Uncle   . Kidney disease Maternal Uncle   . Alcohol abuse Maternal Uncle   . Heart disease Maternal Grandmother   . Hypertension Maternal Grandmother   . Diabetes Maternal Grandmother   . Kidney disease Maternal Grandmother   . Kidney disease Cousin    History  Substance Use Topics  . Smoking status: Never Smoker   . Smokeless tobacco: Never Used  . Alcohol Use: No   OB History    Gravida Para Term Preterm AB TAB SAB Ectopic Multiple Living   4 2 2  0 2 0 1 0 0 2     Review of Systems  Constitutional: Negative.   Respiratory: Negative for shortness of breath.   Cardiovascular: Negative for chest pain.  Gastrointestinal: Positive for  abdominal pain.  Genitourinary: Positive for dysuria, urgency, frequency and pelvic pain. Negative for vaginal bleeding, vaginal discharge and menstrual problem.    Allergies  Latex  Home Medications   Prior to Admission medications   Medication Sig Start Date End Date Taking? Authorizing Provider  acetaminophen (TYLENOL) 325 MG tablet Take 650 mg by mouth every 6 (six) hours as needed.    Historical Provider, MD  cephALEXin (KEFLEX) 500 MG capsule Take 1 capsule (500 mg total) by mouth 4 (four) times daily. Take all of medicine and drink lots of fluids 04/30/15   Linna HoffJames D Geovanna Simko, MD  fluticasone [facility](FLONASE) 50 MCG/ACT nasal spray Place 2 sprays into both nostrils at bedtime. 02/10/15   Ozella Rocksavid J Merrell, MD  ipratropium (ATROVENT) 0.06 % nasal spray Place 2 sprays into both nostrils 4 (four) times daily. 02/10/15   Ozella Rocksavid J Merrell, MD  neomycin-polymyxin-hydrocortisone (CORTISPORIN) otic solution 3-4 drops each ear for 5-7 days 02/10/15   Ozella Rocksavid J Merrell, MD  Pseudoeph-Doxylamine-DM-APAP (NYQUIL PO) Take by mouth.    Historical Provider, MD   BP 122/81 mmHg  Pulse 105  Temp(Src) 99 F (37.2 C) (Oral)  Resp 20  SpO2 99%  LMP 04/06/2015 Physical Exam  Constitutional: She is oriented to person, place, and time. She appears well-developed and well-nourished.  Abdominal: Soft. Normal appearance and bowel sounds are  normal. She exhibits no mass. There is no hepatosplenomegaly. There is tenderness in the suprapubic area. There is no rebound, no guarding and no CVA tenderness.  Neurological: She is alert and oriented to person, place, and time.  Skin: Skin is warm and dry.  Nursing note and vitals reviewed.   ED Course  Procedures (including critical care time) Labs Review Labs Reviewed  POCT URINALYSIS DIP (DEVICE) - Abnormal; Notable for the following:    Hgb urine dipstick LARGE (*)    Protein, ur 100 (*)    Nitrite POSITIVE (*)    Leukocytes, UA SMALL (*)    All other components within  normal limits  POCT PREGNANCY, URINE    Imaging Review No results found.   MDM   1. UTI (lower urinary tract infection)        Linna Hoff, MD 04/30/15 [phone]

## 2015-04-30 NOTE — ED Notes (Signed)
Pt  Reports       Symptoms  Of        Low  Back pain  With  Frequency  Of  Urination  As   Well  As  A    Dark  Appearance  Pt   Reports       Symptoms      X  4  Days

## 2015-05-26 ENCOUNTER — Other Ambulatory Visit (HOSPITAL_COMMUNITY)
Admission: RE | Admit: 2015-05-26 | Discharge: 2015-05-26 | Disposition: A | Discharge status: Home / Self Care | Payer: 59 | Source: Ambulatory Visit | Attending: Emergency Medicine | Admitting: Emergency Medicine

## 2015-05-26 ENCOUNTER — Encounter (HOSPITAL_COMMUNITY): Payer: Self-pay | Admitting: Emergency Medicine

## 2015-05-26 ENCOUNTER — Emergency Department (INDEPENDENT_AMBULATORY_CARE_PROVIDER_SITE_OTHER)
Admission: EM | Admit: 2015-05-26 | Discharge: 2015-05-26 | Disposition: A | Discharge status: Home / Self Care | Payer: 59 | Source: Home / Self Care | Attending: Emergency Medicine | Admitting: Emergency Medicine

## 2015-05-26 DIAGNOSIS — R1032 Left lower quadrant pain: Secondary | ICD-10-CM | POA: Diagnosis not present

## 2015-05-26 DIAGNOSIS — N898 Other specified noninflammatory disorders of vagina: Secondary | ICD-10-CM

## 2015-05-26 DIAGNOSIS — Z113 Encounter for screening for infections with a predominantly sexual mode of transmission: Secondary | ICD-10-CM | POA: Insufficient documentation

## 2015-05-26 DIAGNOSIS — N760 Acute vaginitis: Secondary | ICD-10-CM | POA: Diagnosis present

## 2015-05-26 LAB — POCT URINALYSIS DIP (DEVICE)
Bilirubin Urine: NEGATIVE
Glucose, UA: NEGATIVE mg/dL
Ketones, ur: NEGATIVE mg/dL
LEUKOCYTES UA: NEGATIVE
Nitrite: NEGATIVE
PROTEIN: NEGATIVE mg/dL
Specific Gravity, Urine: 1.02 (ref 1.005–1.030)
Urobilinogen, UA: 1 mg/dL (ref 0.0–1.0)
pH: 7 (ref 5.0–8.0)

## 2015-05-26 LAB — POCT PREGNANCY, URINE: Preg Test, Ur: NEGATIVE

## 2015-05-26 MED ORDER — AZITHROMYCIN 250 MG PO TABS
1000.0000 mg | ORAL_TABLET | Freq: Once | ORAL | Status: AC
  Administered 2015-05-26: 1000 mg via ORAL

## 2015-05-26 MED ORDER — DOXYCYCLINE HYCLATE 100 MG PO CAPS: 100.0000 mg | ORAL_CAPSULE | Freq: Two times a day (BID) | ORAL | Status: DC

## 2015-05-26 MED ORDER — AZITHROMYCIN 250 MG PO TABS
ORAL_TABLET | ORAL | Status: AC
  Filled 2015-05-26: qty 4

## 2015-05-26 MED ORDER — LIDOCAINE HCL (PF) 1 % IJ SOLN
INTRAMUSCULAR | Status: AC
  Filled 2015-05-26: qty 5

## 2015-05-26 MED ORDER — CEFTRIAXONE SODIUM 250 MG IJ SOLR
250.0000 mg | Freq: Once | INTRAMUSCULAR | Status: AC
  Administered 2015-05-26: 250 mg via INTRAMUSCULAR

## 2015-05-26 MED ORDER — CEFTRIAXONE SODIUM 250 MG IJ SOLR
INTRAMUSCULAR | Status: AC
  Filled 2015-05-26: qty 250

## 2015-05-26 NOTE — Discharge Instructions (Signed)
We have collected swabs and blood work for testing. We will call you if anything comes back positive. I sent your urine for culture, to make sure the infection has cleared. We treated you presumptively for gonorrhea and Chlamydia today. Take doxycycline 1 pill twice a day for 10 days for that left lower abdominal pain. Follow-up as needed.

## 2015-05-26 NOTE — ED Provider Notes (Signed)
CSN: [id number]     Arrival date & time 05/26/15  1553 History   First MD Initiated Contact with Patient 05/26/15 1628     Chief Complaint  Patient presents with  . Abdominal Pain   (Consider location/radiation/quality/duration/timing/severity/associated sxs/prior Treatment) HPI She is a 34 year old woman here for evaluation of lower abdominal pain and vaginal discharge.  This started several days ago. She reports the discharge is white. She denies any vaginal itching. There is a mild odor associated with it. Her pain is central and on the left side. It is described as crampy. No fevers or chills. She was treated for a urinary tract infection one month ago and states those symptoms have resolved. She is sexually active with one partner. They do not use condoms. He told her he was recently treated for the "female equivalent of a yeast infection with penicillin."  Past Medical History  Diagnosis Date  . Hyperemesis   . Anxiety   . Yeast infection   . Bacterial infection   . H/O varicella   . Abnormal Pap smear 2006  . Anemia 2002  . H/O chlamydia infection 2010  . History of GBS (group B streptococcus) UTI, currently pregnant 2002  . H/O candidiasis   . H/O cystitis 2012  . H/O pyelonephritis 2011   Past Surgical History  Procedure Laterality Date  . No past surgeries    . Induced abortion  2011   Family History  Problem Relation Age of Onset  . Anesthesia problems Neg Hx   . Mental illness Father   . Hypertension Maternal Aunt   . Diabetes Maternal Aunt   . Kidney disease Maternal Aunt   . Hypertension Maternal Uncle   . Kidney disease Maternal Uncle   . Alcohol abuse Maternal Uncle   . Heart disease Maternal Grandmother   . Hypertension Maternal Grandmother   . Diabetes Maternal Grandmother   . Kidney disease Maternal Grandmother   . Kidney disease Cousin    History  Substance Use Topics  . Smoking status: Never Smoker   . Smokeless tobacco: Never Used  . Alcohol Use:  No   OB History    Gravida Para Term Preterm AB TAB SAB Ectopic Multiple Living   4 2 2  0 2 0 1 0 0 2     Review of Systems As in history of present illness Allergies  Latex  Home Medications   Prior to Admission medications   Medication Sig Start Date End Date Taking? Authorizing Provider  acetaminophen (TYLENOL) 325 MG tablet Take 650 mg by mouth every 6 (six) hours as needed.    Historical Provider, MD  doxycycline (VIBRAMYCIN) 100 MG capsule Take 1 capsule (100 mg total) by mouth 2 (two) times daily. 05/26/15   Charm Rings, MD  fluticasone (FLONASE) 50 MCG/ACT nasal spray Place 2 sprays into both nostrils at bedtime. 02/10/15   Ozella Rocks, MD  ipratropium (ATROVENT) 0.06 % nasal spray Place 2 sprays into both nostrils 4 (four) times daily. 02/10/15   Ozella Rocks, MD  neomycin-polymyxin-hydrocortisone (CORTISPORIN) otic solution 3-4 drops each ear for 5-7 days 02/10/15   Ozella Rocks, MD  Pseudoeph-Doxylamine-DM-APAP (NYQUIL PO) Take by mouth.    Historical Provider, MD   BP 110/75 mmHg  Pulse 74  Temp(Src) 97.5 F (36.4 C) (Oral)  Resp 12  SpO2 98%  LMP 05/01/2015 Physical Exam  Constitutional: She is oriented to person, place, and time. She appears well-developed and well-nourished. No distress.  Cardiovascular:  Normal rate, regular rhythm and normal heart sounds.   No murmur heard. Pulmonary/Chest: Effort normal and breath sounds normal. No respiratory distress. She has no wheezes. She has no rales.  Abdominal: Soft. Bowel sounds are normal. She exhibits no distension and no mass. There is tenderness (suprapubic and left lower quadrant). There is no rebound and no guarding.  Genitourinary: There is no rash or lesion on the right labia. There is no rash or lesion on the left labia. Uterus is tender (Mildly tender). Cervix exhibits no motion tenderness and no discharge. Right adnexum displays no mass, no tenderness and no fullness. Left adnexum displays tenderness  (Mild). Left adnexum displays no mass and no fullness. There is bleeding (Small amount of old blood) in the vagina. No foreign body around the vagina. No vaginal discharge found.  Neurological: She is alert and oriented to person, place, and time.    ED Course  Procedures (including critical care time) Labs Review Labs Reviewed  POCT URINALYSIS DIP (DEVICE) - Abnormal; Notable for the following:    Hgb urine dipstick SMALL (*)    All other components within normal limits  URINE CULTURE  HIV ANTIBODY (ROUTINE TESTING)  RPR  POCT PREGNANCY, URINE  CERVICOVAGINAL ANCILLARY ONLY    Imaging Review No results found.   MDM   1. Abdominal pain, left lower quadrant   2. Vaginal discharge    Treat with ceftriaxone 250 mg IM and azithromycin 1 g by mouth here. Wet prep, gonorrhea, chlamydia, HIV, RPR collected. Will treat for PID given left lower quadrant pain with doxycycline for 10 days. Follow-up as needed.     Charm Rings, MD 05/26/15 1700

## 2015-05-26 NOTE — ED Notes (Signed)
C/o lower abd pain States she has white discharge and odor She was tx for uti recently  States partner stated he was treated but did not say what for

## 2015-05-27 LAB — CERVICOVAGINAL ANCILLARY ONLY
Chlamydia: NEGATIVE
NEISSERIA GONORRHEA: NEGATIVE
Wet Prep (BD Affirm): POSITIVE — AB

## 2015-05-27 LAB — HIV ANTIBODY (ROUTINE TESTING W REFLEX): HIV Screen 4th Generation wRfx: NONREACTIVE

## 2015-05-27 LAB — RPR: RPR Ser Ql: NONREACTIVE

## 2015-05-27 NOTE — ED Notes (Signed)
E-message to Dr Piedad Climes

## 2015-05-28 LAB — URINE CULTURE: Culture: >=100000

## 2015-05-28 NOTE — ED Notes (Addendum)
Final report negative 

## 2015-05-30 MED ORDER — METRONIDAZOLE 500 MG PO TABS: 500.0000 mg | ORAL_TABLET | Freq: Two times a day (BID) | ORAL | Status: DC

## 2015-05-30 MED ORDER — CEPHALEXIN 500 MG PO CAPS: 500.0000 mg | ORAL_CAPSULE | Freq: Four times a day (QID) | ORAL | Status: DC

## 2015-05-31 NOTE — ED Notes (Signed)
Patient returned call . Discussed findings

## 2015-05-31 NOTE — ED Notes (Signed)
Diflucan 150 mg , one at start of symptoms and one in 1 week if still having symptoms. [facility] [place] at patient request.

## 2015-05-31 NOTE — ED Notes (Addendum)
[facility] [place] [facility] by Dr Randal BubaErin Honig for treatment of gardnerella and UTI, w instructions to stop doxycycline. Left detailed VM on patient mobile number . GC, chlamydia , trichomoniasis negative

## 2016-05-16 ENCOUNTER — Ambulatory Visit (HOSPITAL_COMMUNITY)
Admission: EM | Admit: 2016-05-16 | Discharge: 2016-05-16 | Disposition: A | Discharge status: Home / Self Care | Payer: 59 | Attending: Family Medicine | Admitting: Family Medicine

## 2016-05-16 ENCOUNTER — Encounter (HOSPITAL_COMMUNITY): Payer: Self-pay | Admitting: Emergency Medicine

## 2016-05-16 DIAGNOSIS — N390 Urinary tract infection, site not specified: Secondary | ICD-10-CM

## 2016-05-16 LAB — POCT URINALYSIS DIP (DEVICE)
Bilirubin Urine: NEGATIVE
Glucose, UA: NEGATIVE mg/dL
Ketones, ur: NEGATIVE mg/dL
NITRITE: POSITIVE — AB
PH: 6 (ref 5.0–8.0)
PROTEIN: NEGATIVE mg/dL
Specific Gravity, Urine: 1.015 (ref 1.005–1.030)
UROBILINOGEN UA: 0.2 mg/dL (ref 0.0–1.0)

## 2016-05-16 MED ORDER — FLUCONAZOLE 200 MG PO TABS: 200.0000 mg | ORAL_TABLET | Freq: Every day | ORAL | Status: AC

## 2016-05-16 MED ORDER — CEPHALEXIN 500 MG PO CAPS: 500.0000 mg | ORAL_CAPSULE | Freq: Four times a day (QID) | ORAL | Status: DC

## 2016-05-16 MED ORDER — PHENAZOPYRIDINE HCL 200 MG PO TABS: 200.0000 mg | ORAL_TABLET | Freq: Three times a day (TID) | ORAL | Status: DC

## 2016-05-16 NOTE — ED Notes (Signed)
Urine specimen provided by patient while in the waiting room

## 2016-05-16 NOTE — ED Provider Notes (Signed)
CSN: [id number]     Arrival date & time 05/16/16  1505 History   First MD Initiated Contact with Patient 05/16/16 1619     No chief complaint on file.  (Consider location/radiation/quality/duration/timing/severity/associated sxs/prior Treatment) HPI History obtained from patient:   I have a urinary tract infection   6 days ago began to have frequency of urination 5-6 times per day, urine burns when "it hits the skin," occasional vaginal itching.  No relief from increased fluids, cranberry juice. Very pungent odor. Dark colored. Denies fever/chills , no back pain, vomiting Past Medical History  Diagnosis Date  . Hyperemesis   . Anxiety   . Yeast infection   . Bacterial infection   . H/O varicella   . Abnormal Pap smear 2006  . Anemia 2002  . H/O chlamydia infection 2010  . History of GBS (group B streptococcus) UTI, currently pregnant 2002  . H/O candidiasis   . H/O cystitis 2012  . H/O pyelonephritis 2011   Past Surgical History  Procedure Laterality Date  . No past surgeries    . Induced abortion  2011   Family History  Problem Relation Age of Onset  . Anesthesia problems Neg Hx   . Mental illness Father   . Hypertension Maternal Aunt   . Diabetes Maternal Aunt   . Kidney disease Maternal Aunt   . Hypertension Maternal Uncle   . Kidney disease Maternal Uncle   . Alcohol abuse Maternal Uncle   . Heart disease Maternal Grandmother   . Hypertension Maternal Grandmother   . Diabetes Maternal Grandmother   . Kidney disease Maternal Grandmother   . Kidney disease Cousin    Social History  Substance Use Topics  . Smoking status: Never Smoker   . Smokeless tobacco: Never Used  . Alcohol Use: No   OB History    Gravida Para Term Preterm AB TAB SAB Ectopic Multiple Living   0 2 0 1 0 0 2     Review of Systems  Denies: HEADACHE, NAUSEA,  CHEST PAIN, CONGESTION,  SHORTNESS OF BREATH  Allergies  Latex  Home Medications   Prior to Admission medications    Medication Sig Start Date End Date Taking? Authorizing Provider  acetaminophen (TYLENOL) 325 MG tablet Take 650 mg by mouth every 6 (six) hours as needed.    Historical Provider, MD  cephALEXin (KEFLEX) 500 MG capsule Take 1 capsule (500 mg total) by mouth 4 (four) times daily. 05/30/15   Charm Rings, MD  fluticasone (FLONASE) 50 MCG/ACT nasal spray Place 2 sprays into both nostrils at bedtime. 02/10/15   Ozella Rocks, MD  ipratropium (ATROVENT) 0.06 % nasal spray Place 2 sprays into both nostrils 4 (four) times daily. 02/10/15   Ozella Rocks, MD  metroNIDAZOLE (FLAGYL) 500 MG tablet Take 1 tablet (500 mg total) by mouth 2 (two) times daily. 05/30/15   Charm Rings, MD  neomycin-polymyxin-hydrocortisone (CORTISPORIN) otic solution 3-4 drops each ear for 5-7 days 02/10/15   Ozella Rocks, MD  Pseudoeph-Doxylamine-DM-APAP (NYQUIL PO) Take by mouth.    Historical Provider, MD   Meds Ordered and Administered this Visit  Medications - No data to display  BP 114/74 mmHg  Pulse 80  Temp(Src) 98 F (36.7 C) (Oral)  Resp 16  SpO2 99% No data found.   Physical Exam NURSES NOTES AND VITAL SIGNS REVIEWED. CONSTITUTIONAL: Well developed, well nourished, no acute distress HEENT: normocephalic, atraumatic EYES: Conjunctiva normal NECK:normal ROM, supple, no  adenopathy PULMONARY:No respiratory distress, normal effort ABDOMINAL: Soft, ND, NT BS+, No CVAT MUSCULOSKELETAL: Normal ROM of all extremities,  SKIN: warm and dry without rash PSYCHIATRIC: Mood and affect, behavior are normal  ED Course  Procedures (including critical care time)  Labs Review Labs Reviewed  POCT URINALYSIS DIP (DEVICE) - Abnormal; Notable for the following:    Hgb urine dipstick MODERATE (*)    Nitrite POSITIVE (*)    Leukocytes, UA TRACE (*)    All other components within normal limits   Reviewed as part of the MDM Imaging Review No results found.   Visual Acuity Review  Right Eye Distance:   Left Eye  Distance:   Bilateral Distance:    Right Eye Near:   Left Eye Near:    Bilateral Near:       RX Keflex, pyridium, diflucan Expect full recovery  MDM   1. UTI (lower urinary tract infection)     Patient is reassured that there are no issues that require transfer to higher level of care at this time or additional tests. Patient is advised to continue home symptomatic treatment. Patient is advised that if there are new or worsening symptoms to attend the emergency department, contact primary care provider, or return to UC. Instructions of care provided discharged home in stable condition.    THIS NOTE WAS GENERATED USING A VOICE RECOGNITION SOFTWARE PROGRAM. ALL REASONABLE EFFORTS  WERE MADE TO PROOFREAD THIS DOCUMENT FOR ACCURACY.  I have verbally reviewed the discharge instructions with the patient. A printed AVS was given to the patient.  All questions were answered prior to discharge.      Tharon AquasFrank C Jakyrah Holladay, PA 05/16/16 [phone]

## 2016-05-16 NOTE — Discharge Instructions (Signed)

## 2016-09-25 ENCOUNTER — Ambulatory Visit (HOSPITAL_COMMUNITY)
Admission: EM | Admit: 2016-09-25 | Discharge: 2016-09-25 | Disposition: A | Discharge status: Home / Self Care | Payer: Medicaid Other | Attending: Family Medicine | Admitting: Family Medicine

## 2016-09-25 ENCOUNTER — Encounter (HOSPITAL_COMMUNITY): Payer: Self-pay | Admitting: *Deleted

## 2016-09-25 DIAGNOSIS — N10 Acute tubulo-interstitial nephritis: Secondary | ICD-10-CM | POA: Diagnosis not present

## 2016-09-25 DIAGNOSIS — M549 Dorsalgia, unspecified: Secondary | ICD-10-CM | POA: Insufficient documentation

## 2016-09-25 DIAGNOSIS — Z79899 Other long term (current) drug therapy: Secondary | ICD-10-CM | POA: Insufficient documentation

## 2016-09-25 LAB — POCT URINALYSIS DIP (DEVICE)
BILIRUBIN URINE: NEGATIVE
GLUCOSE, UA: NEGATIVE mg/dL
KETONES UR: NEGATIVE mg/dL
NITRITE: NEGATIVE
Protein, ur: 30 mg/dL — AB
Specific Gravity, Urine: 1.01 (ref 1.005–1.030)
Urobilinogen, UA: 0.2 mg/dL (ref 0.0–1.0)
pH: 5.5 (ref 5.0–8.0)

## 2016-09-25 LAB — POCT PREGNANCY, URINE: PREG TEST UR: NEGATIVE

## 2016-09-25 MED ORDER — CEFTRIAXONE SODIUM 1 G IJ SOLR
INTRAMUSCULAR | Status: AC
  Filled 2016-09-25: qty 10

## 2016-09-25 MED ORDER — CEPHALEXIN 500 MG PO CAPS: 500.0000 mg | ORAL_CAPSULE | Freq: Four times a day (QID) | ORAL | 0 refills | Status: DC

## 2016-09-25 MED ORDER — ONDANSETRON 4 MG PO TBDP
ORAL_TABLET | ORAL | Status: AC
Start: 2016-09-25 — End: 2016-09-25
  Filled 2016-09-25: qty 1

## 2016-09-25 MED ORDER — CEFTRIAXONE SODIUM 1 G IJ SOLR
1.0000 g | Freq: Once | INTRAMUSCULAR | Status: AC
  Administered 2016-09-25: 1 g via INTRAMUSCULAR

## 2016-09-25 MED ORDER — ONDANSETRON 4 MG PO TBDP
4.0000 mg | ORAL_TABLET | Freq: Once | ORAL | Status: AC
  Administered 2016-09-25: 4 mg via ORAL

## 2016-09-25 NOTE — Discharge Instructions (Signed)
Take all of medicine as directed, drink lots of fluids, see your doctor if further problems. °

## 2016-09-25 NOTE — ED Provider Notes (Signed)
[facility]    CSN: [id number] Arrival date & time: 09/25/16  1902     History   Chief Complaint Chief Complaint  Patient presents with  . Back Pain    HPI Megan Ballard is a 35 y.o. female.   The history is provided by the patient.  Back Pain  Location:  Lumbar spine Quality:  Burning Radiates to:  Does not radiate Pain severity:  Mild Onset quality:  Gradual Duration:  2 days Progression:  Worsening Chronicity:  New Relieved by:  None tried Ineffective treatments:  None tried Associated symptoms: abdominal pain and fever   Associated symptoms: no leg pain and no paresthesias     Past Medical History:  Diagnosis Date  . Abnormal Pap smear 2006  . Anemia 2002  . Anxiety   . Bacterial infection   . H/O candidiasis   . H/O chlamydia infection 2010  . H/O cystitis 2012  . H/O pyelonephritis 2011  . H/O varicella   . History of GBS (group B streptococcus) UTI, currently pregnant 2002  . Hyperemesis   . Yeast infection     Patient Active Problem List   Diagnosis Date Noted  . UTI (lower urinary tract infection) postpartum 09/06/2012  . Likely kidney stone 9/13. 09/04/2012  . SVD (spontaneous vaginal delivery) 07/09/2012  . History of macrosomia in infant in prior pregnancy, currently pregnant 07/08/2012  . History of pyelonephritis 07/08/2012  . H/O abnormal Pap smear 07/08/2012  . Sickle cell trait (HCC) 01/19/2012  . Anemia 01/19/2012  . Ptyalism 01/19/2012  . 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 INDUCED ABORTION  2011  . NO PAST SURGERIES      OB History    Gravida Para Term Preterm AB Living   4 2 2  0 2 2   SAB TAB Ectopic Multiple Live Births   1 0 0 0 1       Home Medications    Prior to Admission medications   Medication Sig Start Date End Date Taking? Authorizing Provider  acetaminophen (TYLENOL) 325 MG tablet Take 650 mg by mouth every 6 (six) hours as needed.     Historical Provider, MD  cephALEXin (KEFLEX) 500 MG capsule Take 1 capsule (500 mg total) by mouth 4 (four) times daily. Take all of medicine and drink lots of fluids 09/25/16   Linna Hoff, MD  fluticasone [facility]) 50 MCG/ACT nasal spray Place 2 sprays into both nostrils at bedtime. 02/10/15   Ozella Rocks, MD  ipratropium (ATROVENT) 0.06 % nasal spray Place 2 sprays into both nostrils 4 (four) times daily. Patient not taking: Reported on 05/16/2016 02/10/15   Ozella Rocks, MD  metroNIDAZOLE (FLAGYL) 500 MG tablet Take 1 tablet (500 mg total) by mouth 2 (two) times daily. Patient not taking: Reported on 05/16/2016 05/30/15   Charm Rings, MD  neomycin-polymyxin-hydrocortisone (CORTISPORIN) otic solution 3-4 drops each ear for 5-7 days Patient not taking: Reported on 05/16/2016 02/10/15   Ozella Rocks, MD  phenazopyridine (PYRIDIUM) 200 MG tablet Take 1 tablet (200 mg total) by mouth 3 (three) times daily. 05/16/16   Tharon Aquas, PA  Pseudoeph-Doxylamine-DM-APAP (NYQUIL PO) Take by mouth.    Historical Provider, MD    Family History Family History  Problem Relation Age of Onset  . Mental illness Father   . Hypertension Maternal Aunt   . Diabetes Maternal Aunt   . Kidney disease Maternal Aunt   .  Hypertension Maternal Uncle   . Kidney disease Maternal Uncle   . Alcohol abuse Maternal Uncle   . Heart disease Maternal Grandmother   . Hypertension Maternal Grandmother   . Diabetes Maternal Grandmother   . Kidney disease Maternal Grandmother   . Kidney disease Cousin   . Anesthesia problems Neg Hx     Social History Social History  Substance Use Topics  . Smoking status: Never Smoker  . Smokeless tobacco: Never Used  . Alcohol use No     Allergies   Latex   Review of Systems Review of Systems  Constitutional: Positive for fever.  Respiratory: Negative.   Cardiovascular: Negative.   Gastrointestinal: Positive for abdominal pain and nausea.  Musculoskeletal: Positive  for back pain and myalgias.  Neurological: Negative for paresthesias.  All other systems reviewed and are negative.    Physical Exam Triage Vital Signs ED Triage Vitals  Enc Vitals Group     BP 09/25/16 1917 126/79     Pulse Rate 09/25/16 1917 91     Resp 09/25/16 1917 16     Temp 09/25/16 1917 99.3 F (37.4 C)     Temp Source 09/25/16 1917 Oral     SpO2 09/25/16 1917 99 %     Weight --      Height --      Head Circumference --      Peak Flow --      Pain Score 09/25/16 1942 6     Pain Loc --      Pain Edu? --      Excl. in GC? --    No data found.   Updated Vital Signs BP 126/79 (BP Location: Left Arm)   Pulse 91   Temp 99.3 F (37.4 C) (Oral)   Resp 16   LMP 09/12/2016   SpO2 99%   Visual Acuity Right Eye Distance:   Left Eye Distance:   Bilateral Distance:    Right Eye Near:   Left Eye Near:    Bilateral Near:     Physical Exam  Constitutional: She is oriented to person, place, and time. She appears well-developed and well-nourished.  HENT:  Right Ear: External ear normal.  Left Ear: External ear normal.  Neck: Normal range of motion. Neck supple.  Cardiovascular: Normal rate and regular rhythm.   Pulmonary/Chest: Effort normal and breath sounds normal.  Abdominal: Soft. She exhibits no distension and no mass. There is tenderness. There is no rebound and no guarding. No hernia.  Lymphadenopathy:    She has no cervical adenopathy.  Neurological: She is alert and oriented to person, place, and time.  Skin: Skin is warm and dry.  Nursing note and vitals reviewed.    UC Treatments / Results  Labs (all labs ordered are listed, but only abnormal results are displayed) Labs Reviewed  POCT URINALYSIS DIP (DEVICE) - Abnormal; Notable for the following:       Result Value   Hgb urine dipstick LARGE (*)    Protein, ur 30 (*)    Leukocytes, UA TRACE (*)    All other components within normal limits  POCT PREGNANCY, URINE    EKG  EKG  Interpretation None       Radiology No results found.  Procedures Procedures (including critical care time)  Medications Ordered in UC Medications  cefTRIAXone (ROCEPHIN) injection 1 g (1 g Intramuscular Given 09/25/16 2020)     Initial Impression / Assessment and Plan / UC Course  I have  reviewed the triage vital signs and the nursing notes.  Pertinent labs & imaging results that were available during my care of the patient were reviewed by me and considered in my medical decision making (see chart for details).  Clinical Course      Final Clinical Impressions(s) / UC Diagnoses   Final diagnoses:  Pyelonephritis, acute    New Prescriptions New Prescriptions   CEPHALEXIN (KEFLEX) 500 MG CAPSULE    Take 1 capsule (500 mg total) by mouth 4 (four) times daily. Take all of medicine and drink lots of fluids     Linna HoffJames D Kindl, MD 09/25/16 2027

## 2016-09-25 NOTE — ED Triage Notes (Signed)
Pt  Has  Multiple   Symptoms  To  Include   Symptoms      Back  Pain  Unusual  Urine  Color         Headache  earace       With    Some  Nausea   As   Well  Symptoms  X  2  Days

## 2016-09-25 NOTE — ED Notes (Signed)
Observe  For  Signs    Of  Reaction  To  rocephen  No  Reaction

## 2016-09-28 LAB — URINE CULTURE: Culture: >=100000 — AB

## 2016-10-04 ENCOUNTER — Telehealth (HOSPITAL_COMMUNITY): Payer: Self-pay | Admitting: Emergency Medicine

## 2016-10-04 NOTE — Telephone Encounter (Signed)
LM on [phone] Need to give lab results and to see how pt is doing from recent visit on 10/23 Notified pt in gen message that there is NO need to call back Unless not feeling any better, not tolerating meds well or if wanting to know lab results. Also let pt know labs can be obtained from MyChart

## 2016-10-04 NOTE — Telephone Encounter (Signed)
-----   Message from Megan MooreLaura W Murray, MD sent at 09/28/2016  2:19 PM EDT ----- Please let patient know that urine culture is positive for E coli, [facility]   Finish cephalexin.  Recheck for further evaluation if symptoms persist.  LM

## 2017-01-11 ENCOUNTER — Ambulatory Visit (HOSPITAL_COMMUNITY)
Admission: EM | Admit: 2017-01-11 | Discharge: 2017-01-11 | Disposition: A | Discharge status: Home / Self Care | Payer: Self-pay | Attending: Emergency Medicine | Admitting: Emergency Medicine

## 2017-01-11 ENCOUNTER — Encounter (HOSPITAL_COMMUNITY): Payer: Self-pay | Admitting: Emergency Medicine

## 2017-01-11 DIAGNOSIS — N390 Urinary tract infection, site not specified: Secondary | ICD-10-CM | POA: Insufficient documentation

## 2017-01-11 DIAGNOSIS — R319 Hematuria, unspecified: Secondary | ICD-10-CM | POA: Insufficient documentation

## 2017-01-11 DIAGNOSIS — R300 Dysuria: Secondary | ICD-10-CM | POA: Insufficient documentation

## 2017-01-11 LAB — POCT URINALYSIS DIP (DEVICE)
Bilirubin Urine: NEGATIVE
Glucose, UA: NEGATIVE mg/dL
Ketones, ur: NEGATIVE mg/dL
NITRITE: NEGATIVE
PH: 6 (ref 5.0–8.0)
PROTEIN: NEGATIVE mg/dL
Specific Gravity, Urine: 1.02 (ref 1.005–1.030)
Urobilinogen, UA: 0.2 mg/dL (ref 0.0–1.0)

## 2017-01-11 LAB — POCT PREGNANCY, URINE: PREG TEST UR: NEGATIVE

## 2017-01-11 MED ORDER — NITROFURANTOIN MONOHYD MACRO 100 MG PO CAPS: 100.0000 mg | ORAL_CAPSULE | Freq: Two times a day (BID) | ORAL | 0 refills | Status: DC

## 2017-01-11 MED ORDER — PHENAZOPYRIDINE HCL 200 MG PO TABS: 200.0000 mg | ORAL_TABLET | Freq: Three times a day (TID) | ORAL | 0 refills | Status: DC

## 2017-01-11 MED ORDER — METRONIDAZOLE 500 MG PO TABS: 500.0000 mg | ORAL_TABLET | Freq: Two times a day (BID) | ORAL | 0 refills | Status: AC

## 2017-01-11 NOTE — ED Triage Notes (Signed)
Pain with urination and headache.  Pt denies any back or abdominal pain, no fever, and no odor or discharge.

## 2017-01-11 NOTE — ED Provider Notes (Addendum)
HPI  SUBJECTIVE:  Megan FurbishShamekka D Ballard is a 36 y.o. female who presents with 6 days of dysuria, cloudy or odorous urine, hematuria. She tried increasing her fluids and decreasing her Pepsi intake. This is helped her symptoms. Symptoms are worse with urinating. She denies urinary urgency, frequency. No nausea, vomiting, fevers, abdominal pain, pelvic pain, back pain. She reports a vaginal odor, but denies vaginal bleeding, discharge, general rash or itching. She is a long-term monogamous relationship with a female who is asymptomatic. STDs are not a concern today. No antibiotics in the past month. No antipyretic in the past 6-8 hours. No perfumed body washes or soaps. Past medical history of UTI, group B strep UTIs, pyelonephritis, hematuria. Last UTI in October 2017 was Escherichia coli that was pansensitive. Patient states that she has a history of BV, Yeast. No History of Nephrolithiasis, Diabetes, Hypertension, Gonorrhea, Chlamydia, Herpes, HIV, Syphilis, Trichomonas. LMP: 1/8. PMD: None.  Past Medical History:  Diagnosis Date  . Abnormal Pap smear 2006  . Anemia 2002  . Anxiety   . Bacterial infection   . H/O candidiasis   . H/O chlamydia infection 2010  . H/O cystitis 2012  . H/O pyelonephritis 2011  . H/O varicella   . History of GBS (group B streptococcus) UTI, currently pregnant 2002  . Hyperemesis   . Yeast infection     Past Surgical History:  Procedure Laterality Date  . INDUCED ABORTION  2011  . NO PAST SURGERIES      Family History  Problem Relation Age of Onset  . Mental illness Father   . Hypertension Maternal Aunt   . Diabetes Maternal Aunt   . Kidney disease Maternal Aunt   . Hypertension Maternal Uncle   . Kidney disease Maternal Uncle   . Alcohol abuse Maternal Uncle   . Heart disease Maternal Grandmother   . Hypertension Maternal Grandmother   . Diabetes Maternal Grandmother   . Kidney disease Maternal Grandmother   . Kidney disease Cousin   . Anesthesia  problems Neg Hx     Social History  Substance Use Topics  . Smoking status: Never Smoker  . Smokeless tobacco: Never Used  . Alcohol use No    No current facility-administered medications for this encounter.   Current Outpatient Prescriptions:  .  acetaminophen (TYLENOL) 325 MG tablet, Take 650 mg by mouth every 6 (six) hours as needed., Disp: , Rfl:  .  fluticasone (FLONASE) 50 MCG/ACT nasal spray, Place 2 sprays into both nostrils at bedtime., Disp: 16 g, Rfl: 0 .  metroNIDAZOLE (FLAGYL) 500 MG tablet, Take 1 tablet (500 mg total) by mouth 2 (two) times daily., Disp: 14 tablet, Rfl: 0 .  nitrofurantoin, macrocrystal-monohydrate, (MACROBID) 100 MG capsule, Take 1 capsule (100 mg total) by mouth 2 (two) times daily. X 5 days, Disp: 10 capsule, Rfl: 0 .  phenazopyridine (PYRIDIUM) 200 MG tablet, Take 1 tablet (200 mg total) by mouth 3 (three) times daily., Disp: 6 tablet, Rfl: 0  Allergies  Allergen Reactions  . Latex Rash     ROS  As noted in HPI.   Physical Exam  BP 116/68 (BP Location: Right Arm)   Pulse 73   Temp 98.9 F (37.2 C) (Oral)   LMP 12/11/2016 (Approximate)   SpO2 100%   Constitutional: Well developed, well nourished, no acute distress Eyes:  EOMI, conjunctiva normal bilaterally HENT: Normocephalic, atraumatic,mucus membranes moist Respiratory: Normal inspiratory effort Cardiovascular: Normal rate GI: nondistended Soft, nontender. Normal bowel sounds. No suprapubic, flank  tenderness Back: No CVA tenderness GU: Deferred per patient request skin: No rash, skin intact Musculoskeletal: no deformities Neurologic: Alert & oriented x 3, no focal neuro deficits Psychiatric: Speech and behavior appropriate   ED Course   Medications - No data to display  Orders Placed This Encounter  Procedures  . Urine culture    Standing Status:   Standing    Number of Occurrences:   1  . POCT urinalysis dip (device)    Standing Status:   Standing    Number of  Occurrences:   1  . Pregnancy, urine POC    Standing Status:   Standing    Number of Occurrences:   1    Results for orders placed or performed during the hospital encounter of 01/11/17 (from the past 24 hour(s))  POCT urinalysis dip (device)     Status: Abnormal   Collection Time: 01/11/17  8:10 PM  Result Value Ref Range   Glucose, UA NEGATIVE NEGATIVE mg/dL   Bilirubin Urine NEGATIVE NEGATIVE   Ketones, ur NEGATIVE NEGATIVE mg/dL   Specific Gravity, Urine 1.020 1.005 - 1.030   Hgb urine dipstick MODERATE (A) NEGATIVE   pH 6.0 5.0 - 8.0   Protein, ur NEGATIVE NEGATIVE mg/dL   Urobilinogen, UA 0.2 0.0 - 1.0 mg/dL   Nitrite NEGATIVE NEGATIVE   Leukocytes, UA TRACE (A) NEGATIVE  Pregnancy, urine POC     Status: None   Collection Time: 01/11/17  8:12 PM  Result Value Ref Range   Preg Test, Ur NEGATIVE NEGATIVE   No results found.  ED Clinical Impression  Urinary tract infection with hematuria, site unspecified   ED Assessment/Plan   Previous results and notes reviewed. As noted in history of present illness.  Urine pregnancy negative. She has moderate hematuria, trace leukocytes, suggestive of UTI. We'll send urine off for culture to confirm antibiotic choice.   Discussed with patient that gynecologic infections can also cause urinary tract type symptoms and given that she has a vaginal odor  BV or Trichomonas is in the ddx. She declined a pelvic exam tonight, would like to be treated empirically for UTI and also for BV since she states that she gets this frequently. If she does not get better after finishing the Macrobid, she will try the Flagyl. If she does not do better after finishing both of these, she will return here or follow-up with a primary care physician of her choice for reevaluation. Feel that this is reasonable given that she is a long-term monogamous relationship. Doubt PID, Nephrolithiasis, perinephric abscess, intra-abdominal process. Providing primary care  referral list   Discussed labs,  MDM, plan and followup with patient. Discussed sn/sx that should prompt return to the ED. Patient agrees with plan.   Meds ordered this encounter  Medications  . metroNIDAZOLE (FLAGYL) 500 MG tablet    Sig: Take 1 tablet (500 mg total) by mouth 2 (two) times daily.    Dispense:  14 tablet    Refill:  0  . phenazopyridine (PYRIDIUM) 200 MG tablet    Sig: Take 1 tablet (200 mg total) by mouth 3 (three) times daily.    Dispense:  6 tablet    Refill:  0  . nitrofurantoin, macrocrystal-monohydrate, (MACROBID) 100 MG capsule    Sig: Take 1 capsule (100 mg total) by mouth 2 (two) times daily. X 5 days    Dispense:  10 capsule    Refill:  0    *This clinic note was created  using Scientist, clinical (histocompatibility and immunogenetics). Therefore, there may be occasional mistakes despite careful proofreading.  ?   Domenick Gong, MD 01/11/17 1610    Domenick Gong, MD 01/11/17 2106

## 2017-01-13 LAB — URINE CULTURE

## 2017-04-24 ENCOUNTER — Encounter (HOSPITAL_COMMUNITY): Payer: Self-pay | Admitting: Emergency Medicine

## 2017-04-24 ENCOUNTER — Ambulatory Visit (HOSPITAL_COMMUNITY)
Admission: EM | Admit: 2017-04-24 | Discharge: 2017-04-24 | Disposition: A | Discharge status: Home / Self Care | Payer: 59 | Attending: Family Medicine | Admitting: Family Medicine

## 2017-04-24 DIAGNOSIS — R05 Cough: Secondary | ICD-10-CM

## 2017-04-24 DIAGNOSIS — J00 Acute nasopharyngitis [common cold]: Secondary | ICD-10-CM

## 2017-04-24 DIAGNOSIS — R059 Cough, unspecified: Secondary | ICD-10-CM

## 2017-04-24 MED ORDER — METHYLPREDNISOLONE 4 MG PO TBPK: ORAL_TABLET | ORAL | 0 refills | Status: DC

## 2017-04-24 MED ORDER — IPRATROPIUM BROMIDE 0.06 % NA SOLN: 2.0000 | Freq: Four times a day (QID) | NASAL | 0 refills | Status: DC

## 2017-04-24 MED ORDER — BENZONATATE 100 MG PO CAPS: 200.0000 mg | ORAL_CAPSULE | Freq: Three times a day (TID) | ORAL | 0 refills | Status: DC | PRN

## 2017-04-24 NOTE — ED Triage Notes (Signed)
The patient presented to the [facility] with a complaint of a cough and nasal congestion x 3 days. 

## 2017-04-24 NOTE — ED Provider Notes (Signed)
CSN: [id number]     Arrival date & time 04/24/17  1405 History   None    Chief Complaint  Patient presents with  . Cough   (Consider location/radiation/quality/duration/timing/severity/associated sxs/prior Treatment) Patient c/o cough and nasal congestion for 3 days.   The history is provided by the patient.  Cough  Cough characteristics:  Productive Sputum characteristics:  White Severity:  Mild Onset quality:  Sudden Duration:  3 days Timing:  Constant Progression:  Waxing and waning Chronicity:  New Smoker: no   Context: upper respiratory infection and weather changes   Relieved by:  Nothing Worsened by:  Nothing Ineffective treatments:  None tried   Past Medical History:  Diagnosis Date  . Abnormal Pap smear 2006  . Anemia 2002  . Anxiety   . Bacterial infection   . H/O candidiasis   . H/O chlamydia infection 2010  . H/O cystitis 2012  . H/O pyelonephritis 2011  . H/O varicella   . History of GBS (group B streptococcus) UTI, currently pregnant 2002  . Hyperemesis   . Yeast infection    Past Surgical History:  Procedure Laterality Date  . INDUCED ABORTION  2011  . NO PAST SURGERIES     Family History  Problem Relation Age of Onset  . Mental illness Father   . Hypertension Maternal Aunt   . Diabetes Maternal Aunt   . Kidney disease Maternal Aunt   . Hypertension Maternal Uncle   . Kidney disease Maternal Uncle   . Alcohol abuse Maternal Uncle   . Heart disease Maternal Grandmother   . Hypertension Maternal Grandmother   . Diabetes Maternal Grandmother   . Kidney disease Maternal Grandmother   . Kidney disease Cousin   . Anesthesia problems Neg Hx    Social History  Substance Use Topics  . Smoking status: Never Smoker  . Smokeless tobacco: Never Used  . Alcohol use No   OB History    Gravida Para Term Preterm AB Living   4 2 2  0 2 2   SAB TAB Ectopic Multiple Live Births   1 0 0 0 1     Review of Systems  Constitutional: Negative.    HENT: Negative.   Eyes: Negative.   Respiratory: Positive for cough.   Cardiovascular: Negative.   Gastrointestinal: Negative.   Endocrine: Negative.   Genitourinary: Negative.   Musculoskeletal: Negative.   Allergic/Immunologic: Negative.   Neurological: Negative.   Hematological: Negative.   Psychiatric/Behavioral: Negative.     Allergies  Latex  Home Medications   Prior to Admission medications   Medication Sig Start Date End Date Taking? Authorizing Provider  benzonatate (TESSALON) 100 MG capsule Take 2 capsules (200 mg total) by mouth 3 (three) times daily as needed for cough. 04/24/17   Deatra Canter, FNP  ipratropium (ATROVENT) 0.06 % nasal spray Place 2 sprays into both nostrils 4 (four) times daily. 04/24/17   Deatra Canter, FNP  methylPREDNISolone (MEDROL DOSEPAK) 4 MG TBPK tablet Take 6-5-4-3-2-1 po qd 04/24/17   Deatra Canter, FNP   Meds Ordered and Administered this Visit  Medications - No data to display  BP 111/73 (BP Location: Right Arm)   Pulse 79   Temp 98.8 F (37.1 C) (Oral)   Resp 18   SpO2 99%  No data found.   Physical Exam  Constitutional: She is oriented to person, place, and time. She appears well-developed and well-nourished.  HENT:  Head: Normocephalic and atraumatic.  Eyes: Conjunctivae and EOM  are normal. Pupils are equal, round, and reactive to light.  Neck: Normal range of motion. Neck supple.  Cardiovascular: Normal rate, regular rhythm and normal heart sounds.   Pulmonary/Chest: Effort normal and breath sounds normal.  Abdominal: Soft. Bowel sounds are normal.  Musculoskeletal: Normal range of motion.  Neurological: She is alert and oriented to person, place, and time.  Nursing note and vitals reviewed.   Urgent Care Course     Procedures (including critical care time)  Labs Review Labs Reviewed - No data to display  Imaging Review No results found.   Visual Acuity Review  Right Eye Distance:   Left Eye  Distance:   Bilateral Distance:    Right Eye Near:   Left Eye Near:    Bilateral Near:         MDM   1. Cough   2. Nasopharyngitis    Medrol dose pack Tessalon Perles Atrovent Nasal Spray  Push po fluids, rest, tylenol and motrin otc prn as directed for fever, arthralgias, and myalgias.  Follow up prn if sx's continue or persist.    Deatra CanterOxford, Ethel Veronica J, FNP 04/24/17 1621

## 2017-08-22 ENCOUNTER — Encounter (HOSPITAL_COMMUNITY): Payer: Self-pay | Admitting: *Deleted

## 2017-08-22 ENCOUNTER — Inpatient Hospital Stay (HOSPITAL_COMMUNITY): Payer: Self-pay

## 2017-08-22 ENCOUNTER — Inpatient Hospital Stay (HOSPITAL_COMMUNITY)
Admission: AD | Admit: 2017-08-22 | Discharge: 2017-08-23 | Disposition: A | Discharge status: Home / Self Care | Payer: Self-pay | Source: Ambulatory Visit | Attending: Obstetrics & Gynecology | Admitting: Obstetrics & Gynecology

## 2017-08-22 DIAGNOSIS — Z87440 Personal history of urinary (tract) infections: Secondary | ICD-10-CM | POA: Insufficient documentation

## 2017-08-22 DIAGNOSIS — O219 Vomiting of pregnancy, unspecified: Secondary | ICD-10-CM | POA: Insufficient documentation

## 2017-08-22 DIAGNOSIS — O2341 Unspecified infection of urinary tract in pregnancy, first trimester: Secondary | ICD-10-CM | POA: Insufficient documentation

## 2017-08-22 DIAGNOSIS — R12 Heartburn: Secondary | ICD-10-CM | POA: Insufficient documentation

## 2017-08-22 DIAGNOSIS — O09521 Supervision of elderly multigravida, first trimester: Secondary | ICD-10-CM | POA: Insufficient documentation

## 2017-08-22 DIAGNOSIS — D573 Sickle-cell trait: Secondary | ICD-10-CM | POA: Insufficient documentation

## 2017-08-22 DIAGNOSIS — Z3A01 Less than 8 weeks gestation of pregnancy: Secondary | ICD-10-CM | POA: Insufficient documentation

## 2017-08-22 DIAGNOSIS — O26891 Other specified pregnancy related conditions, first trimester: Secondary | ICD-10-CM | POA: Insufficient documentation

## 2017-08-22 DIAGNOSIS — O99011 Anemia complicating pregnancy, first trimester: Secondary | ICD-10-CM | POA: Insufficient documentation

## 2017-08-22 DIAGNOSIS — Z91040 Latex allergy status: Secondary | ICD-10-CM | POA: Insufficient documentation

## 2017-08-22 DIAGNOSIS — Z818 Family history of other mental and behavioral disorders: Secondary | ICD-10-CM | POA: Insufficient documentation

## 2017-08-22 LAB — WET PREP, GENITAL
Sperm: NONE SEEN
Trich, Wet Prep: NONE SEEN
Yeast Wet Prep HPF POC: NONE SEEN

## 2017-08-22 LAB — CBC WITH DIFFERENTIAL/PLATELET
Basophils Absolute: 0 10*3/uL (ref 0.0–0.1)
Basophils Relative: 0 %
EOS ABS: 0.1 10*3/uL (ref 0.0–0.7)
Eosinophils Relative: 1 %
HCT: 34.7 % — ABNORMAL LOW (ref 36.0–46.0)
Hemoglobin: 12 g/dL (ref 12.0–15.0)
LYMPHS ABS: 2.5 10*3/uL (ref 0.7–4.0)
Lymphocytes Relative: 24 %
MCH: 28.8 pg (ref 26.0–34.0)
MCHC: 34.6 g/dL (ref 30.0–36.0)
MCV: 83.4 fL (ref 78.0–100.0)
MONOS PCT: 5 %
Monocytes Absolute: 0.6 10*3/uL (ref 0.1–1.0)
Neutro Abs: 7.4 10*3/uL (ref 1.7–7.7)
Neutrophils Relative %: 70 %
Platelets: 284 10*3/uL (ref 150–400)
RBC: 4.16 MIL/uL (ref 3.87–5.11)
RDW: 14 % (ref 11.5–15.5)
WBC: 10.5 10*3/uL (ref 4.0–10.5)

## 2017-08-22 LAB — URINALYSIS, ROUTINE W REFLEX MICROSCOPIC
Bilirubin Urine: NEGATIVE
GLUCOSE, UA: NEGATIVE mg/dL
Ketones, ur: NEGATIVE mg/dL
Nitrite: POSITIVE — AB
PH: 6 (ref 5.0–8.0)
PROTEIN: 30 mg/dL — AB
Specific Gravity, Urine: 1.01 (ref 1.005–1.030)

## 2017-08-22 LAB — POCT PREGNANCY, URINE: Preg Test, Ur: POSITIVE — AB

## 2017-08-22 LAB — BASIC METABOLIC PANEL
Anion gap: 9 (ref 5–15)
BUN: 12 mg/dL (ref 6–20)
CHLORIDE: 105 mmol/L (ref 101–111)
CO2: 23 mmol/L (ref 22–32)
CREATININE: 0.59 mg/dL (ref 0.44–1.00)
Calcium: 9.4 mg/dL (ref 8.9–10.3)
GFR calc Af Amer: >60 mL/min (ref >60)
GFR calc non Af Amer: >60 mL/min (ref >60)
GLUCOSE: 92 mg/dL (ref 65–99)
Potassium: 3.3 mmol/L — ABNORMAL LOW (ref 3.5–5.1)
SODIUM: 137 mmol/L (ref 135–145)

## 2017-08-22 LAB — URINALYSIS, MICROSCOPIC (REFLEX)

## 2017-08-22 MED ORDER — LACTATED RINGERS IV SOLN
INTRAVENOUS | Status: AC
  Administered 2017-08-22: 22:00:00 via INTRAVENOUS

## 2017-08-22 MED ORDER — DEXTROSE 5 % IV SOLN
2.0000 g | Freq: Once | INTRAVENOUS | Status: AC
  Administered 2017-08-22: 2 g via INTRAVENOUS
  Filled 2017-08-22: qty 2

## 2017-08-22 MED ORDER — PROMETHAZINE HCL 25 MG/ML IJ SOLN
12.5000 mg | Freq: Once | INTRAMUSCULAR | Status: AC
  Administered 2017-08-22: 12.5 mg via INTRAVENOUS
  Filled 2017-08-22: qty 1

## 2017-08-22 MED ORDER — FAMOTIDINE IN NACL 20-0.9 MG/50ML-% IV SOLN
20.0000 mg | Freq: Once | INTRAVENOUS | Status: AC
  Administered 2017-08-22: 20 mg via INTRAVENOUS
  Filled 2017-08-22: qty 50

## 2017-08-22 NOTE — MAU Note (Signed)
Can't throwing up, can't hold nothing down.  +HPT 4 days ago.  (approx 100cc liquid emesis in bag).

## 2017-08-22 NOTE — MAU Provider Note (Signed)
[facility] Provider Note   CSN: [id number] Arrival date & time: 08/22/17  1816     History   Chief Complaint Chief Complaint  Patient presents with  . Possible Pregnancy  . Emesis    HPI Megan Ballard is a 37 y.o. [id number] @ [redacted]w[redacted]d gestation who presents to the MAU with nausea and vomiting that started a few days ago and has gotten worse. Patient reports that she vomites over 6 times per day. She has not started prenatal care. She denies fever or other problems.   HPI  Past Medical History:  Diagnosis Date  . Abnormal Pap smear 2006  . Anemia 2002  . Anxiety   . Bacterial infection   . H/O candidiasis   . H/O chlamydia infection 2010  . H/O cystitis 2012  . H/O pyelonephritis 2011  . H/O varicella   . History of GBS (group B streptococcus) UTI, currently pregnant 2002  . Hyperemesis   . Yeast infection     Patient Active Problem List   Diagnosis Date Noted  . UTI (lower urinary tract infection) postpartum 09/06/2012  . Likely kidney stone 9/13. 09/04/2012  . SVD (spontaneous vaginal delivery) 07/09/2012  . History of macrosomia in infant in prior pregnancy, currently pregnant 07/08/2012  . History of pyelonephritis 07/08/2012  . H/O abnormal Pap smear 07/08/2012  . Sickle cell trait (HCC) 01/19/2012  . Anemia 01/19/2012  . Ptyalism 01/19/2012  . 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 INDUCED ABORTION  2011  . NO PAST SURGERIES      OB History    Gravida Para Term Preterm AB Living   0 2 2   SAB TAB Ectopic Multiple Live Births   1 0 0 0 1       Home Medications    Prior to Admission medications   Medication Sig Start Date End Date Taking? Authorizing Provider  benzonatate (TESSALON) 100 MG capsule Take 2 capsules (200 mg total) by mouth 3 (three) times daily as needed for cough. 04/24/17   Deatra Canter, FNP  ipratropium (ATROVENT) 0.06 % nasal spray Place 2 sprays into both  nostrils 4 (four) times daily. 04/24/17   Deatra Canter, FNP  methylPREDNISolone (MEDROL DOSEPAK) 4 MG TBPK tablet Take 6-5-4-3-2-1 po qd 04/24/17   Deatra Canter, FNP    Family History Family History  Problem Relation Age of Onset  . Mental illness Father   . Hypertension Maternal Aunt   . Diabetes Maternal Aunt   . Kidney disease Maternal Aunt   . Hypertension Maternal Uncle   . Kidney disease Maternal Uncle   . Alcohol abuse Maternal Uncle   . Heart disease Maternal Grandmother   . Hypertension Maternal Grandmother   . Diabetes Maternal Grandmother   . Kidney disease Maternal Grandmother   . Kidney disease Cousin   . Anesthesia problems Neg Hx     Social History Social History  Substance Use Topics  . Smoking status: Never Smoker  . Smokeless tobacco: Never Used  . Alcohol use No     Allergies   Latex   Review of Systems Review of Systems  Constitutional: Negative for chills and fever.  HENT: Negative.   Respiratory: Negative for shortness of breath.   Gastrointestinal: Positive for nausea and vomiting.  Genitourinary: Positive for frequency. Negative for dysuria, vaginal bleeding and vaginal discharge.  Musculoskeletal: Negative for back pain.  Skin: Negative for  rash.  Neurological: Negative for syncope and headaches.  Psychiatric/Behavioral: Negative for confusion.     Physical Exam Updated Vital Signs BP 130/80 (BP Location: Left Arm)   Pulse 74   Temp 98.7 F (37.1 C) (Oral)   Resp 20   Wt 170 lb 8 oz (77.3 kg)   LMP 07/07/2017   SpO2 100%   BMI 30.20 kg/m   Physical Exam  Constitutional: She is oriented to person, place, and time. She appears well-developed and well-nourished. No distress.  HENT:  Head: Normocephalic and atraumatic.  Eyes: EOM are normal.  Neck: Neck supple.  Cardiovascular: Normal rate and regular rhythm.   Pulmonary/Chest: Effort normal and breath sounds normal.  Abdominal: Soft. Bowel sounds are normal. There is  tenderness in the epigastric area. There is no CVA tenderness.  Genitourinary:  Genitourinary Comments: External genitalia without lesions. Frothy d/c vaginal vault, cervix long, closed, no CMT. Uterus approximately 8 week size.   Musculoskeletal: Normal range of motion.  Neurological: She is alert and oriented to person, place, and time. No cranial nerve deficit.  Skin: Skin is warm and dry.  Nursing note and vitals reviewed.    ED Treatments / Results  Labs (all labs ordered are listed, but only abnormal results are displayed) Labs Reviewed  URINALYSIS, ROUTINE W REFLEX MICROSCOPIC - Abnormal; Notable for the following:       Result Value   Hgb urine dipstick MODERATE (*)    Protein, ur 30 (*)    Nitrite POSITIVE (*)    Leukocytes, UA MODERATE (*)    All other components within normal limits  URINALYSIS, MICROSCOPIC (REFLEX) - Abnormal; Notable for the following:    Bacteria, UA MANY (*)    Squamous Epithelial / LPF 0-5 (*)    All other components within normal limits  CBC WITH DIFFERENTIAL/PLATELET - Abnormal; Notable for the following:    HCT 34.7 (*)    All other components within normal limits  BASIC METABOLIC PANEL - Abnormal; Notable for the following:    Potassium 3.3 (*)    All other components within normal limits  POCT PREGNANCY, URINE - Abnormal; Notable for the following:    Preg Test, Ur POSITIVE (*)    All other components within normal limits  CULTURE, OB URINE  WET PREP, GENITAL  GC/CHLAMYDIA PROBE AMP ([place]) NOT AT [facility]   Radiology US Ob Comp Less 14 Wks  Result Date: 08/22/2017 CLINICAL DATA:  Initial evaluation for persistent vomiting, abdominal pain. EXAM: OBSTETRIC <14 WK [place] AND TRANSVAGINAL OB US TECHNIQUE: Both transabdominal and transvaginal ultrasound examinations were performed for complete evaluation of the gestation as well as the maternal uterus, adnexal regions, and pelvic cul-de-sac. Transvaginal technique was performed to assess early  pregnancy. COMPARISON:  None. FINDINGS: Intrauterine gestational sac: Single Yolk sac:  Present Embryo:  Present Cardiac Activity: Present Heart Rate: 123  bpm CRL:  7.0  mm   6 w   3 d                  [place] EDC: 04/14/2018 Subchorionic hemorrhage:  None visualized. Maternal uterus/adnexae: Ovaries well visualized within the adnexa bilaterally. Small left ovarian corpus luteal cyst noted. No adnexal mass. No free fluid. IMPRESSION: 1. Single viable intrauterine pregnancy as above without complication. Estimated gestational age [redacted] weeks and 3 days by sonography. 2. No other acute maternal uterine or adnexal abnormality identified. Electronically Signed   By: Rise Mu M.D.   On: 08/22/2017 22:36  US Ob Transvaginal  Result Date: 08/22/2017 CLINICAL DATA:  Initial evaluation for persistent vomiting, abdominal pain. EXAM: OBSTETRIC <14 WK [place] AND TRANSVAGINAL OB US TECHNIQUE: Both transabdominal and transvaginal ultrasound examinations were performed for complete evaluation of the gestation as well as the maternal uterus, adnexal regions, and pelvic cul-de-sac. Transvaginal technique was performed to assess early pregnancy. COMPARISON:  None. FINDINGS: Intrauterine gestational sac: Single Yolk sac:  Present Embryo:  Present Cardiac Activity: Present Heart Rate: 123  bpm CRL:  7.0  mm   6 w   3 d                  [place] EDC: 04/14/2018 Subchorionic hemorrhage:  None visualized. Maternal uterus/adnexae: Ovaries well visualized within the adnexa bilaterally. Small left ovarian corpus luteal cyst noted. No adnexal mass. No free fluid. IMPRESSION: 1. Single viable intrauterine pregnancy as above without complication. Estimated gestational age [redacted] weeks and 3 days by sonography. 2. No other acute maternal uterine or adnexal abnormality identified. Electronically Signed   By: Rise Mu M.D.   On: 08/22/2017 22:36    Procedures Procedures (including critical care time)  Medications Ordered in  ED Medications  lactated ringers infusion ( Intravenous Stopped 08/22/17 2329)  promethazine (PHENERGAN) injection 12.5 mg (12.5 mg Intravenous Given 08/22/17 2137)  famotidine (PEPCID) IVPB 20 mg premix (0 mg Intravenous Stopped 08/22/17 2208)  cefTRIAXone (ROCEPHIN) 2 g in dextrose 5 % 50 mL IVPB (2 g Intravenous New Bag/Given 08/22/17 2329)     Initial Impression / Assessment and Plan / ED Course  I have reviewed the triage vital signs and the nursing notes.  Pertinent labs & imaging results that were available during my care of the patient were reviewed by me and considered in my medical decision making (see chart for details).  36 y.o. [id number] @ [redacted]w[redacted]d gestation with viable IUP on ultrasound  nausea, vomiting and UTI feeling better after IV fluids and phenergan. Rocephin 2 grams IV given prior to d/c. Will d/c home with Keflex, Pepcid and medication for nausea. Urine sent for culture.   Care turned over to Judeth Horn, NP @ 12:00 MN Wet prep pending.  Final Clinical Impressions(s) / ED Diagnoses   Final diagnoses:  Nausea and vomiting in pregnancy prior to [redacted] weeks gestation  UTI in pregnancy, antepartum, first trimester    New Prescriptions Current Discharge Medication List      A: 1. Nausea and vomiting in pregnancy prior to [redacted] weeks gestation   2. UTI in pregnancy, antepartum, first trimester   3. [redacted] weeks gestation of pregnancy   4. Heartburn during pregnancy in first trimester    P: Discharge home Rx phenergan, keflex, & zantac Discussed reasons to return to MAU Keep follow up appointment with OB/PCP  GC/CT & urine culture pending  Judeth Horn, NP

## 2017-08-23 DIAGNOSIS — O219 Vomiting of pregnancy, unspecified: Secondary | ICD-10-CM

## 2017-08-23 MED ORDER — PROMETHAZINE HCL 25 MG PO TABS: 25.0000 mg | ORAL_TABLET | Freq: Four times a day (QID) | ORAL | 0 refills | Status: DC | PRN

## 2017-08-23 MED ORDER — RANITIDINE HCL 150 MG PO TABS: 150.0000 mg | ORAL_TABLET | Freq: Two times a day (BID) | ORAL | 0 refills | Status: DC

## 2017-08-23 MED ORDER — CEPHALEXIN 500 MG PO CAPS: 500.0000 mg | ORAL_CAPSULE | Freq: Four times a day (QID) | ORAL | 0 refills | Status: DC

## 2017-08-23 NOTE — Discharge Instructions (Signed)
Pregnancy and Urinary Tract Infection What is a urinary tract infection? A urinary tract infection (UTI) is an infection of any part of the urinary tract. This includes the kidneys, the tubes that connect your kidneys to your bladder (ureters), the bladder, and the tube that carries urine out of your body (urethra). These organs make, store, and get rid of urine in the body. A UTI can be a bladder infection (cystitis) or a kidney infection (pyelonephritis). This infection may be caused by fungi, viruses, and bacteria. Bacteria are the most common cause of UTIs. You are more likely to develop a UTI during pregnancy because:  The physical and hormonal changes your body goes through can make it easier for bacteria to get into your urinary tract.  Your growing baby puts pressure on your uterus and can affect urine flow.  Does a UTI place my baby at risk? An untreated UTI during pregnancy could lead to a kidney infection, which can cause health problems that could affect your baby. Possible complications of an untreated UTI include:  Having your baby before 37 weeks of pregnancy (premature).  Having a baby with a low birth weight.  Developing high blood pressure during pregnancy (preeclampsia).  What are the symptoms of a UTI? Symptoms of a UTI include:  Fever.  Frequent urination or passing small amounts of urine frequently.  Needing to urinate urgently.  Pain or a burning sensation with urination.  Urine that smells bad or unusual.  Cloudy urine.  Pain in the lower abdomen or back.  Trouble urinating.  Blood in the urine.  Vomiting or being less hungry than normal.  Diarrhea or abdominal pain.  Vaginal discharge.  What are the treatment options for a UTI during pregnancy? Treatment for this condition may include:  Antibiotic medicines that are safe to take during pregnancy.  Other medicines to treat less common causes of UTI.  How can I prevent a UTI?  To prevent a  UTI:  Go to the bathroom as soon as you feel the need.  Always wipe from front to back.  Wash your genital area with soap and warm water daily.  Empty your bladder before and after sex.  Wear cotton underwear.  Limit your intake of high sugar foods or drinks, such as regular soda, juice, and sweets.  Drink 6-8 glasses of water daily.  Do not wear tight-fitting pants.  Do not douche or use deodorant sprays.  Do not drink alcohol, caffeine, or carbonated drinks. These can irritate the bladder.  Contact a health care provider if:  Your symptoms do not improve or get worse.  You have a fever after two days of treatment.  You have a rash.  You have abnormal vaginal discharge.  You have back or side pain.  You have chills.  You have nausea and vomiting. Get help right away if: Seek immediate medical care if you are pregnant and:  You feel contractions in your uterus.  You have lower belly pain.  You have a gush of fluid from your vagina.  You have blood in your urine.  You are vomiting and cannot keep down any medicines or water.  This information is not intended to replace advice given to you by your health care provider. Make sure you discuss any questions you have with your health care provider. Document Released: 03/17/2011 Document Revised: 11/03/2016 Document Reviewed: 10/11/2015 Elsevier Interactive Patient Education  2017 [employer]vier Inc.     Morning Sickness Morning sickness is when you feel  sick to your stomach (nauseous) during pregnancy. This nauseous feeling may or may not come with vomiting. It often occurs in the morning but can be a problem any time of day. Morning sickness is most common during the first trimester, but it may continue throughout pregnancy. While morning sickness is unpleasant, it is usually harmless unless you develop severe and continual vomiting (hyperemesis gravidarum). This condition requires more intense treatment. What are the  causes? The cause of morning sickness is not completely known but seems to be related to normal hormonal changes that occur in pregnancy. What increases the risk? You are at greater risk if you:  Experienced nausea or vomiting before your pregnancy.  Had morning sickness during a previous pregnancy.  Are pregnant with more than one baby, such as twins.  How is this treated? Do not use any medicines (prescription, over-the-counter, or herbal) for morning sickness without first talking to your health care provider. Your health care provider may prescribe or recommend:  Vitamin B6 supplements.  Anti-nausea medicines.  The herbal medicine ginger.  Follow these instructions at home:  Only take over-the-counter or prescription medicines as directed by your health care provider.  Taking multivitamins before getting pregnant can prevent or decrease the severity of morning sickness in most women.  Eat a piece of dry toast or unsalted crackers before getting out of bed in the morning.  Eat five or six small meals a day.  Eat dry and bland foods (rice, baked potato). Foods high in carbohydrates are often helpful.  Do not drink liquids with your meals. Drink liquids between meals.  Avoid greasy, fatty, and spicy foods.  Get someone to cook for you if the smell of any food causes nausea and vomiting.  If you feel nauseous after taking prenatal vitamins, take the vitamins at night or with a snack.  Snack on protein foods (nuts, yogurt, cheese) between meals if you are hungry.  Eat unsweetened gelatins for desserts.  Wearing an acupressure wristband (worn for sea sickness) may be helpful.  Acupuncture may be helpful.  Do not smoke.  Get a humidifier to keep the air in your house free of odors.  Get plenty of fresh air. Contact a health care provider if:  Your home remedies are not working, and you need medicine.  You feel dizzy or lightheaded.  You are losing weight. Get  help right away if:  You have persistent and uncontrolled nausea and vomiting.  You pass out (faint). This information is not intended to replace advice given to you by your health care provider. Make sure you discuss any questions you have with your health care provider. Document Released: 01/11/2007 Document Revised: 04/27/2016 Document Reviewed: 05/07/2013 Elsevier Interactive Patient Education  2017 [employer].

## 2017-08-24 LAB — GC/CHLAMYDIA PROBE AMP (~~LOC~~) NOT AT ARMC
Chlamydia: NEGATIVE
Neisseria Gonorrhea: NEGATIVE

## 2017-08-25 LAB — CULTURE, OB URINE: Culture: >=100000 — AB

## 2017-08-26 ENCOUNTER — Encounter (HOSPITAL_COMMUNITY): Payer: Self-pay | Admitting: Certified Nurse Midwife

## 2017-08-26 ENCOUNTER — Inpatient Hospital Stay (HOSPITAL_COMMUNITY)
Admission: AD | Admit: 2017-08-26 | Discharge: 2017-08-27 | Disposition: A | Discharge status: Home / Self Care | Payer: Medicaid Other | Source: Ambulatory Visit | Attending: Obstetrics and Gynecology | Admitting: Obstetrics and Gynecology

## 2017-08-26 DIAGNOSIS — O2341 Unspecified infection of urinary tract in pregnancy, first trimester: Secondary | ICD-10-CM | POA: Insufficient documentation

## 2017-08-26 DIAGNOSIS — K117 Disturbances of salivary secretion: Secondary | ICD-10-CM

## 2017-08-26 DIAGNOSIS — O219 Vomiting of pregnancy, unspecified: Secondary | ICD-10-CM

## 2017-08-26 DIAGNOSIS — Z3A01 Less than 8 weeks gestation of pregnancy: Secondary | ICD-10-CM | POA: Insufficient documentation

## 2017-08-26 DIAGNOSIS — O99611 Diseases of the digestive system complicating pregnancy, first trimester: Secondary | ICD-10-CM | POA: Insufficient documentation

## 2017-08-26 DIAGNOSIS — O210 Mild hyperemesis gravidarum: Secondary | ICD-10-CM | POA: Insufficient documentation

## 2017-08-26 LAB — URINALYSIS, ROUTINE W REFLEX MICROSCOPIC
Bilirubin Urine: NEGATIVE
Glucose, UA: NEGATIVE mg/dL
Ketones, ur: 5 mg/dL — AB
Nitrite: NEGATIVE
PROTEIN: 100 mg/dL — AB
Specific Gravity, Urine: 1.016 (ref 1.005–1.030)
pH: 5 (ref 5.0–8.0)

## 2017-08-26 LAB — POCT PREGNANCY, URINE: PREG TEST UR: POSITIVE — AB

## 2017-08-26 NOTE — MAU Note (Signed)
Patient presents to MAU with c/o nausea and vomiting. Was seen 4 days again and since 5pm has not been able to keep down phenergan and antibiotics for UTI. Denies VB, LOF.

## 2017-08-27 DIAGNOSIS — O219 Vomiting of pregnancy, unspecified: Secondary | ICD-10-CM

## 2017-08-27 LAB — COMPREHENSIVE METABOLIC PANEL
ALBUMIN: 4.2 g/dL (ref 3.5–5.0)
ALT: 13 U/L — ABNORMAL LOW (ref 14–54)
AST: 19 U/L (ref 15–41)
Alkaline Phosphatase: 69 U/L (ref 38–126)
Anion gap: 12 (ref 5–15)
BUN: 10 mg/dL (ref 6–20)
CHLORIDE: 101 mmol/L (ref 101–111)
CO2: 22 mmol/L (ref 22–32)
Calcium: 9.6 mg/dL (ref 8.9–10.3)
Creatinine, Ser: 0.61 mg/dL (ref 0.44–1.00)
GFR calc Af Amer: >60 mL/min (ref >60)
GFR calc non Af Amer: >60 mL/min (ref >60)
GLUCOSE: 89 mg/dL (ref 65–99)
POTASSIUM: 3.7 mmol/L (ref 3.5–5.1)
SODIUM: 135 mmol/L (ref 135–145)
TOTAL PROTEIN: 8.1 g/dL (ref 6.5–8.1)
Total Bilirubin: 0.7 mg/dL (ref 0.3–1.2)

## 2017-08-27 LAB — CBC
HCT: 35.3 % — ABNORMAL LOW (ref 36.0–46.0)
Hemoglobin: 12.5 g/dL (ref 12.0–15.0)
MCH: 29.1 pg (ref 26.0–34.0)
MCHC: 35.4 g/dL (ref 30.0–36.0)
MCV: 82.1 fL (ref 78.0–100.0)
PLATELETS: 289 10*3/uL (ref 150–400)
RBC: 4.3 MIL/uL (ref 3.87–5.11)
RDW: 13.6 % (ref 11.5–15.5)
WBC: 9.6 10*3/uL (ref 4.0–10.5)

## 2017-08-27 MED ORDER — FAMOTIDINE IN NACL 20-0.9 MG/50ML-% IV SOLN
20.0000 mg | Freq: Once | INTRAVENOUS | Status: AC
  Administered 2017-08-27: 20 mg via INTRAVENOUS
  Filled 2017-08-27: qty 50

## 2017-08-27 MED ORDER — PROMETHAZINE HCL 25 MG PO TABS: 12.5000 mg | ORAL_TABLET | Freq: Four times a day (QID) | ORAL | 5 refills | Status: DC | PRN

## 2017-08-27 MED ORDER — METOCLOPRAMIDE HCL 10 MG PO TABS: 10.0000 mg | ORAL_TABLET | Freq: Three times a day (TID) | ORAL | 3 refills | Status: DC | PRN

## 2017-08-27 MED ORDER — LACTATED RINGERS IV BOLUS (SEPSIS)
1000.0000 mL | Freq: Once | INTRAVENOUS | Status: AC
  Administered 2017-08-27: 1000 mL via INTRAVENOUS

## 2017-08-27 MED ORDER — CEFTRIAXONE SODIUM 1 G IJ SOLR
1.0000 g | Freq: Once | INTRAMUSCULAR | Status: AC
  Administered 2017-08-27: 1 g via INTRAMUSCULAR
  Filled 2017-08-27: qty 10

## 2017-08-27 MED ORDER — GLYCOPYRROLATE 2 MG PO TABS: 2.0000 mg | ORAL_TABLET | Freq: Three times a day (TID) | ORAL | 3 refills | Status: DC | PRN

## 2017-08-27 MED ORDER — GLYCOPYRROLATE 0.2 MG/ML IJ SOLN
0.2000 mg | Freq: Once | INTRAMUSCULAR | Status: AC
  Administered 2017-08-27: 0.2 mg via INTRAVENOUS
  Filled 2017-08-27: qty 1

## 2017-08-27 MED ORDER — PROMETHAZINE HCL 25 MG/ML IJ SOLN
25.0000 mg | Freq: Once | INTRAMUSCULAR | Status: AC
  Administered 2017-08-27: 25 mg via INTRAVENOUS
  Filled 2017-08-27: qty 1

## 2017-08-27 NOTE — MAU Provider Note (Signed)
Chief Complaint: Morning Sickness   First Provider Initiated Contact with Patient 08/27/17 0149      SUBJECTIVE HPI: Megan Ballard is a 36 y.o. [id number] at [redacted]w[redacted]d by LMP c/w 6 week [place] who presents to maternity admissions reporting nausea and vomiting, making her unable to keep down her nausea medication and medication for UTI prescribed 4 days ago.  She reports she was keeping down Phenergan until yesterday but is now vomiting when she takes these.  She reports an increase in saliva associated with her nausea. She has had this in all her previous pregnancies.  She denies other associated symptoms. She has not tried any other treatments.   She denies abdominal pain, vaginal bleeding, vaginal itching/burning, urinary symptoms, h/a, dizziness, or fever/chills.     HPI  Past Medical History:  Diagnosis Date  . Abnormal Pap smear 2006  . Anemia 2002  . Anxiety   . Bacterial infection   . H/O candidiasis   . H/O chlamydia infection 2010  . H/O cystitis 2012  . H/O pyelonephritis 2011  . H/O varicella   . History of GBS (group B streptococcus) UTI, currently pregnant 2002  . Hyperemesis   . Yeast infection    Past Surgical History:  Procedure Laterality Date  . INDUCED ABORTION  2011  . NO PAST SURGERIES     Social History   Social History  . Marital status: Single    Spouse name: N/A  . Number of children: N/A  . Years of education: N/A   Occupational History  . Not on file.   Social History Main Topics  . Smoking status: Never Smoker  . Smokeless tobacco: Never Used  . Alcohol use No  . Drug use: No  . Sexual activity: Yes    Birth control/ protection: None   Other Topics Concern  . Not on file   Social History Narrative  . No narrative on file   No current facility-administered medications on file prior to encounter.    Current Outpatient Prescriptions on File Prior to Encounter  Medication Sig Dispense Refill  . cephALEXin (KEFLEX) 500 MG capsule Take 1  capsule (500 mg total) by mouth 4 (four) times daily. 28 capsule 0  . ranitidine (ZANTAC) 150 MG tablet Take 1 tablet (150 mg total) by mouth 2 (two) times daily. 60 tablet 0   Allergies  Allergen Reactions  . Latex Rash    ROS:  Review of Systems  Constitutional: Negative for chills, fatigue and fever.  Respiratory: Negative for shortness of breath.   Cardiovascular: Negative for chest pain.  Gastrointestinal: Positive for nausea and vomiting. Negative for abdominal pain.  Genitourinary: Negative for difficulty urinating, dysuria, flank pain, pelvic pain, vaginal bleeding, vaginal discharge and vaginal pain.  Neurological: Negative for dizziness and headaches.  Psychiatric/Behavioral: Negative.      I have reviewed patient's Past Medical Hx, Surgical Hx, Family Hx, Social Hx, medications and allergies.   Physical Exam   Patient Vitals for the past 24 hrs:  BP Temp Temp src Pulse Resp SpO2  08/26/17 2307 131/70 99.4 F (37.4 C) Oral 78 18 98 %   Constitutional: Well-developed, well-nourished female in no acute distress.  Cardiovascular: normal rate Respiratory: normal effort GI: Abd soft, non-tender. Pos BS x 4 MS: Extremities nontender, no edema, normal ROM Neurologic: Alert and oriented x 4.  GU: Neg CVAT.  PELVIC EXAM: Deferred  LAB RESULTS Results for orders placed or performed during the hospital encounter of 08/26/17 (from the  past 24 hour(s))  Urinalysis, Routine w reflex microscopic     Status: Abnormal   Collection Time: 08/26/17 11:09 PM  Result Value Ref Range   Color, Urine YELLOW YELLOW   APPearance CLOUDY (A) CLEAR   Specific Gravity, Urine 1.016 1.005 - 1.030   pH 5.0 5.0 - 8.0   Glucose, UA NEGATIVE NEGATIVE mg/dL   Hgb urine dipstick MODERATE (A) NEGATIVE   Bilirubin Urine NEGATIVE NEGATIVE   Ketones, ur 5 (A) NEGATIVE mg/dL   Protein, ur 161 (A) NEGATIVE mg/dL   Nitrite NEGATIVE NEGATIVE   Leukocytes, UA SMALL (A) NEGATIVE   RBC / HPF 6-30 0 -  5 RBC/hpf   WBC, UA 6-30 0 - 5 WBC/hpf   Bacteria, UA RARE (A) NONE SEEN   Squamous Epithelial / LPF 6-30 (A) NONE SEEN   Mucus PRESENT    Sperm, UA PRESENT   Pregnancy, urine POC     Status: Abnormal   Collection Time: 08/26/17 11:21 PM  Result Value Ref Range   Preg Test, Ur POSITIVE (A) NEGATIVE  CBC     Status: Abnormal   Collection Time: 08/27/17  1:40 AM  Result Value Ref Range   WBC 9.6 4.0 - 10.5 K/uL   RBC 4.30 3.87 - 5.11 MIL/uL   Hemoglobin 12.5 12.0 - 15.0 g/dL   HCT 09.6 (L) 04.5 - 40.9 %   MCV 82.1 78.0 - 100.0 fL   MCH 29.1 26.0 - 34.0 pg   MCHC 35.4 30.0 - 36.0 g/dL   RDW 81.1 91.4 - 78.2 %   Platelets 289 150 - 400 K/uL  Comprehensive metabolic panel     Status: Abnormal   Collection Time: 08/27/17  1:40 AM  Result Value Ref Range   Sodium 135 135 - 145 mmol/L   Potassium 3.7 3.5 - 5.1 mmol/L   Chloride 101 101 - 111 mmol/L   CO2 22 22 - 32 mmol/L   Glucose, Bld 89 65 - 99 mg/dL   BUN 10 6 - 20 mg/dL   Creatinine, Ser 9.56 0.44 - 1.00 mg/dL   Calcium 9.6 8.9 - 21.3 mg/dL   Total Protein 8.1 6.5 - 8.1 g/dL   Albumin 4.2 3.5 - 5.0 g/dL   AST 19 15 - 41 U/L   ALT 13 (L) 14 - 54 U/L   Alkaline Phosphatase 69 38 - 126 U/L   Total Bilirubin 0.7 0.3 - 1.2 mg/dL   GFR calc non Af Amer >60 >60 mL/min   GFR calc Af Amer >60 >60 mL/min   Anion gap 12 5 - 15       IMAGING   MAU Management/MDM: Ordered labs and reviewed results.  Urine shows improvement from u/a on 9/19.  Pt not aware that Phenergan can be placed vaginally so discuss this use of the medication with pt.  LR x 1000 ml, Phenergan 25 mg IV, Robinul 0.2 mg IV, and Pepcid 20 mg IV given in MAU.  Pt able to tolerate some PO food/fluids.  D/C home with Rx for Reglan and Robinul and renew Rx for Phenergan. Pt to take Reglan in daytime and Phenergan at night.  F/U in office for early prenatal care.  Pt stable at time of discharge.  ASSESSMENT 1. Nausea and vomiting during pregnancy prior to [redacted] weeks  gestation   2. Ptyalism   3. UTI (urinary tract infection) during pregnancy, first trimester     PLAN Discharge home Allergies as of 08/27/2017  Reactions   Latex Rash      Medication List    STOP taking these medications   cephALEXin 500 MG capsule Commonly known as:  KEFLEX     TAKE these medications   glycopyrrolate 2 MG tablet Commonly known as:  ROBINUL Take 1 tablet (2 mg total) by mouth 3 (three) times daily as needed.   metoCLOPramide 10 MG tablet Commonly known as:  REGLAN Take 1 tablet (10 mg total) by mouth 3 (three) times daily as needed for nausea. Take Reglan in the daytime and Phenergan at night, if Reglan is not controlling symptoms, resume Phenergan every 6 hours.   promethazine 25 MG tablet Commonly known as:  PHENERGAN Take 0.5-1 tablets (12.5-25 mg total) by mouth every 6 (six) hours as needed for nausea or vomiting. You may place a tablet vaginally or rectally every 6 hours if unable to keep down dose by mouth. What changed:  how much to take  additional instructions   ranitidine 150 MG tablet Commonly known as:  ZANTAC Take 1 tablet (150 mg total) by mouth 2 (two) times daily.            Discharge Care Instructions        Start     Ordered   08/27/17 0000  glycopyrrolate (ROBINUL) 2 MG tablet  3 times daily PRN    Question:  Supervising Provider  Answer:  Tilda Burrow   08/27/17 0357   08/27/17 0000  metoCLOPramide (REGLAN) 10 MG tablet  3 times daily PRN    Question:  Supervising Provider  Answer:  Tilda Burrow   08/27/17 0357   08/27/17 0000  promethazine (PHENERGAN) 25 MG tablet  Every 6 hours PRN    Question:  Supervising Provider  Answer:  Tilda Burrow   08/27/17 0357   08/27/17 0000  Discharge patient    Question Answer Comment  Discharge disposition 01-Home or Self Care   Discharge patient date 08/27/2017      08/27/17 0357     Follow-up Information    Department, [facility] Follow up.   Why:   Or prenatal provider of your choice, see list provided. Return to MAU as needed for emergencies. Contact information: [address] [place] [address] [phone]           Sharen Counter Certified Nurse-Midwife 08/27/2017  3:59 AM

## 2018-01-16 ENCOUNTER — Ambulatory Visit (HOSPITAL_COMMUNITY)
Admission: EM | Admit: 2018-01-16 | Discharge: 2018-01-16 | Disposition: A | Discharge status: Home / Self Care | Payer: Medicaid Other | Attending: Family Medicine | Admitting: Family Medicine

## 2018-01-16 ENCOUNTER — Other Ambulatory Visit: Payer: Self-pay

## 2018-01-16 ENCOUNTER — Encounter (HOSPITAL_COMMUNITY): Payer: Self-pay | Admitting: Emergency Medicine

## 2018-01-16 DIAGNOSIS — N898 Other specified noninflammatory disorders of vagina: Secondary | ICD-10-CM

## 2018-01-16 DIAGNOSIS — R300 Dysuria: Secondary | ICD-10-CM

## 2018-01-16 DIAGNOSIS — L0291 Cutaneous abscess, unspecified: Secondary | ICD-10-CM | POA: Insufficient documentation

## 2018-01-16 DIAGNOSIS — Z3202 Encounter for pregnancy test, result negative: Secondary | ICD-10-CM

## 2018-01-16 DIAGNOSIS — N764 Abscess of vulva: Secondary | ICD-10-CM

## 2018-01-16 LAB — POCT URINALYSIS DIP (DEVICE)
BILIRUBIN URINE: NEGATIVE
GLUCOSE, UA: NEGATIVE mg/dL
KETONES UR: NEGATIVE mg/dL
Nitrite: NEGATIVE
Protein, ur: 30 mg/dL — AB
Specific Gravity, Urine: 1.02 (ref 1.005–1.030)
Urobilinogen, UA: 1 mg/dL (ref 0.0–1.0)
pH: 6 (ref 5.0–8.0)

## 2018-01-16 LAB — POCT PREGNANCY, URINE: PREG TEST UR: NEGATIVE

## 2018-01-16 MED ORDER — DOXYCYCLINE HYCLATE 100 MG PO CAPS: 100.0000 mg | ORAL_CAPSULE | Freq: Two times a day (BID) | ORAL | 0 refills | Status: AC

## 2018-01-16 NOTE — ED Triage Notes (Signed)
Pt c/o of cyst on the outside lip of her vagina x2 weeks, pt also states it burns and feels tingling sensation when she pees.

## 2018-01-16 NOTE — Discharge Instructions (Signed)
Please take doxycycline twice daily for 10 days.  Please do daily warm baths soaks with massaging of the area to express further drainage.  You may also do warm compresses multiple times a day with massage.  Please return if symptoms not improving with treatment or worsening.

## 2018-01-16 NOTE — ED Provider Notes (Signed)
[facility]    CSN: [id number] Arrival date & time: 01/16/18  1723     History   Chief Complaint Chief Complaint  Patient presents with  . Cyst  . Dysuria    HPI Megan Ballard is a 37 y.o. female noncontributing past medical history presenting today with concern for a cyst on her genital area.  She states that she has had a nodule there for the past couple of years it has not changed, she recently shaved and soaked in apple cider vinegar and cocoa butter 2 weeks ago and since then she has noticed an increase in size and pain of her labia.  She also notes burning with urination, states that she feels this is separate from urine over the abscess.  She has had vaginal discharge as well.  Denies abdominal pain, nausea, vomiting, diarrhea, fever.  Denies back pain.  HPI  Past Medical History:  Diagnosis Date  . Abnormal Pap smear 2006  . Anemia 2002  . Anxiety   . Bacterial infection   . H/O candidiasis   . H/O chlamydia infection 2010  . H/O cystitis 2012  . H/O pyelonephritis 2011  . H/O varicella   . History of GBS (group B streptococcus) UTI, currently pregnant 2002  . Hyperemesis   . Yeast infection     Patient Active Problem List   Diagnosis Date Noted  . UTI (lower urinary tract infection) postpartum 09/06/2012  . Likely kidney stone 9/13. 09/04/2012  . SVD (spontaneous vaginal delivery) 07/09/2012  . History of macrosomia in infant in prior pregnancy, currently pregnant 07/08/2012  . History of pyelonephritis 07/08/2012  . H/O abnormal Pap smear 07/08/2012  . Sickle cell trait (HCC) 01/19/2012  . Anemia 01/19/2012  . Ptyalism 01/19/2012  . 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 INDUCED ABORTION  2011  . NO PAST SURGERIES      OB History    Gravida Para Term Preterm AB Living   5 2 2  0 2 2   SAB TAB Ectopic Multiple Live Births   1 0 0 0 1       Home Medications    Prior to Admission  medications   Medication Sig Start Date End Date Taking? Authorizing Provider  doxycycline (VIBRAMYCIN) 100 MG capsule Take 1 capsule (100 mg total) by mouth 2 (two) times daily for 10 days. 01/16/18 01/26/18  Ashlea Dusing C, PA-C  glycopyrrolate (ROBINUL) 2 MG tablet Take 1 tablet (2 mg total) by mouth 3 (three) times daily as needed. 08/27/17   Leftwich-Kirby, Wilmer FloorLisa A, CNM  metoCLOPramide (REGLAN) 10 MG tablet Take 1 tablet (10 mg total) by mouth 3 (three) times daily as needed for nausea. Take Reglan in the daytime and Phenergan at night, if Reglan is not controlling symptoms, resume Phenergan every 6 hours. 08/27/17   Leftwich-Kirby, Wilmer FloorLisa A, CNM  promethazine (PHENERGAN) 25 MG tablet Take 0.5-1 tablets (12.5-25 mg total) by mouth every 6 (six) hours as needed for nausea or vomiting. You may place a tablet vaginally or rectally every 6 hours if unable to keep down dose by mouth. 08/27/17   Leftwich-Kirby, Wilmer FloorLisa A, CNM  ranitidine (ZANTAC) 150 MG tablet Take 1 tablet (150 mg total) by mouth 2 (two) times daily. 08/23/17   Judeth HornLawrence, Erin, NP    Family History Family History  Problem Relation Age of Onset  . Mental illness Father   . Hypertension Maternal Aunt   .  Diabetes Maternal Aunt   . Kidney disease Maternal Aunt   . Hypertension Maternal Uncle   . Kidney disease Maternal Uncle   . Alcohol abuse Maternal Uncle   . Heart disease Maternal Grandmother   . Hypertension Maternal Grandmother   . Diabetes Maternal Grandmother   . Kidney disease Maternal Grandmother   . Kidney disease Cousin   . Anesthesia problems Neg Hx     Social History Social History   Tobacco Use  . Smoking status: Never Smoker  . Smokeless tobacco: Never Used  Substance Use Topics  . Alcohol use: No  . Drug use: No     Allergies   Latex   Review of Systems Review of Systems  Constitutional: Negative for fever.  Respiratory: Negative for shortness of breath.   Cardiovascular: Negative for chest pain.    Gastrointestinal: Negative for abdominal pain, diarrhea, nausea and vomiting.  Genitourinary: Positive for dysuria and vaginal discharge. Negative for flank pain, frequency, genital sores, hematuria, menstrual problem, vaginal bleeding and vaginal pain.       Abscess  Musculoskeletal: Negative for back pain.  Skin: Negative for rash.  Neurological: Negative for dizziness, light-headedness and headaches.     Physical Exam Triage Vital Signs ED Triage Vitals  Enc Vitals Group     BP 01/16/18 1816 118/68     Pulse Rate 01/16/18 1816 79     Resp 01/16/18 1816 18     Temp 01/16/18 1816 97.6 F (36.4 C)     Temp src --      SpO2 01/16/18 1816 100 %     Weight --      Height --      Head Circumference --      Peak Flow --      Pain Score 01/16/18 1817 8     Pain Loc --      Pain Edu? --      Excl. in GC? --    No data found.  Updated Vital Signs BP 118/68   Pulse 79   Temp 97.6 F (36.4 C)   Resp 18   LMP 01/06/2018   SpO2 100%   Breastfeeding? Unknown    Physical Exam  Constitutional: She appears well-developed and well-nourished. No distress.  HENT:  Head: Normocephalic and atraumatic.  Eyes: Conjunctivae are normal.  Neck: Neck supple.  Cardiovascular: Normal rate and regular rhythm.  No murmur heard. Pulmonary/Chest: Effort normal and breath sounds normal. No respiratory distress.  Abdominal: Soft. There is no tenderness.  Genitourinary:  Genitourinary Comments: Large 2-3 cm abscess on left lower labia with erythema  White discharge on speculum exam  Musculoskeletal: She exhibits no edema.  Neurological: She is alert.  Skin: Skin is warm and dry.  Psychiatric: She has a normal mood and affect.  Nursing note and vitals reviewed.    UC Treatments / Results  Labs (all labs ordered are listed, but only abnormal results are displayed) Labs Reviewed  POCT URINALYSIS DIP (DEVICE) - Abnormal; Notable for the following components:      Result Value   Hgb  urine dipstick MODERATE (*)    Protein, ur 30 (*)    Leukocytes, UA SMALL (*)    All other components within normal limits  URINE CULTURE  POCT PREGNANCY, URINE  CERVICOVAGINAL ANCILLARY ONLY    EKG  EKG Interpretation None       Radiology No results found.  Procedures Incision and Drainage Date/Time: 01/16/2018 7:25 PM Performed by: Jerlyn Pain, [employer]  C, PA-C Authorized by: Mardella Layman, MD   Consent:    Consent obtained:  Verbal   Consent given by:  Patient   Risks discussed:  Incomplete drainage, infection and pain   Alternatives discussed:  Alternative treatment Location:    Type:  Abscess   Size:  3   Location:  Anogenital   Anogenital location:  Vulva Pre-procedure details:    Skin preparation:  Betadine Anesthesia (see MAR for exact dosages):    Anesthesia method:  Local infiltration   Local anesthetic:  Lidocaine 2% WITH epi Procedure type:    Complexity:  Simple Procedure details:    Needle aspiration: no     Incision types:  Stab incision   Incision depth:  Subcutaneous   Scalpel blade:  11   Wound management:  Probed and deloculated   Drainage:  Purulent and bloody   Drainage amount:  Moderate   Wound treatment:  Wound left open   Packing materials:  None Post-procedure details:    Patient tolerance of procedure:  Tolerated well, no immediate complications   (including critical care time)  Medications Ordered in UC Medications - No data to display   Initial Impression / Assessment and Plan / UC Course  I have reviewed the triage vital signs and the nursing notes.  Pertinent labs & imaging results that were available during my care of the patient were reviewed by me and considered in my medical decision making (see chart for details).     I&D performed, will send home with doxycycline to take twice daily times 10 days, sits baths multiple times a day with or warm compresses with massage. Discussed strict return precautions. Patient verbalized  understanding and is agreeable with plan.  Urine with small leuks, will send for culture.  Vaginal swab obtained.  Will call with results and further treatment.   Final Clinical Impressions(s) / UC Diagnoses   Final diagnoses:  Abscess    ED Discharge Orders        Ordered    doxycycline (VIBRAMYCIN) 100 MG capsule  2 times daily     01/16/18 1901       Controlled Substance Prescriptions [place] Controlled Substance Registry consulted? Not Applicable   Lew Dawes, PA-C 01/16/18 1927    Lew Dawes, [place] 01/16/18 1927

## 2018-01-17 LAB — CERVICOVAGINAL ANCILLARY ONLY
Bacterial vaginitis: POSITIVE — AB
CHLAMYDIA, DNA PROBE: NEGATIVE
Candida vaginitis: NEGATIVE
NEISSERIA GONORRHEA: NEGATIVE
TRICH (WINDOWPATH): NEGATIVE

## 2018-01-19 LAB — URINE CULTURE: Culture: >=100000 — AB

## 2018-01-20 ENCOUNTER — Telehealth (HOSPITAL_COMMUNITY): Payer: Self-pay | Admitting: Internal Medicine

## 2018-01-20 MED ORDER — CEPHALEXIN 500 MG PO CAPS: 500.0000 mg | ORAL_CAPSULE | Freq: Two times a day (BID) | ORAL | 0 refills | Status: DC

## 2018-01-20 NOTE — Telephone Encounter (Signed)
Clinical staff, please let patient know that urine culture was positive for E coli germ, not sensitive to doxycycline rx given at the urgent care visit for labial abscess.  The E coli germ is sensitive to cephalexin.  Rx cephal[place]in sent [facility] [place] pharmacy of [place], [facility] [place].   Test for gardnerella (bacterial vaginosis) was also positive.  This only needs to be treated if there are persistent symptoms, such as vaginal irritation/discharge.   If these symptoms are present, ok to send rx for metronidazole 500mg  bid x 7d #14 no refills or metronidazole vaginal gel 0.75% 1 applicatorful bid x 7d #14 no refills.  Recheck for further evaluation if symptoms are not improving.  LM

## 2018-01-21 ENCOUNTER — Telehealth (HOSPITAL_COMMUNITY): Payer: Self-pay | Admitting: Internal Medicine

## 2018-01-21 MED ORDER — METRONIDAZOLE 500 MG PO TABS: 500.0000 mg | ORAL_TABLET | Freq: Two times a day (BID) | ORAL | 0 refills | Status: AC

## 2018-01-21 MED ORDER — CEPHALEXIN 500 MG PO CAPS: 500.0000 mg | ORAL_CAPSULE | Freq: Two times a day (BID) | ORAL | 0 refills | Status: AC

## 2018-01-21 MED ORDER — FLUCONAZOLE 150 MG PO TABS: 150.0000 mg | ORAL_TABLET | ORAL | 0 refills | Status: AC

## 2018-04-17 ENCOUNTER — Encounter (HOSPITAL_COMMUNITY): Payer: Self-pay | Admitting: Emergency Medicine

## 2018-04-17 ENCOUNTER — Other Ambulatory Visit: Payer: Self-pay

## 2018-04-17 ENCOUNTER — Ambulatory Visit (HOSPITAL_COMMUNITY)
Admission: EM | Admit: 2018-04-17 | Discharge: 2018-04-17 | Disposition: A | Discharge status: Home / Self Care | Payer: Self-pay | Attending: Family Medicine | Admitting: Family Medicine

## 2018-04-17 DIAGNOSIS — N390 Urinary tract infection, site not specified: Secondary | ICD-10-CM

## 2018-04-17 DIAGNOSIS — J4521 Mild intermittent asthma with (acute) exacerbation: Secondary | ICD-10-CM

## 2018-04-17 LAB — POCT URINALYSIS DIP (DEVICE)
Bilirubin Urine: NEGATIVE
GLUCOSE, UA: NEGATIVE mg/dL
Ketones, ur: NEGATIVE mg/dL
LEUKOCYTES UA: NEGATIVE
NITRITE: NEGATIVE
Protein, ur: 100 mg/dL — AB
SPECIFIC GRAVITY, URINE: 1.02 (ref 1.005–1.030)
UROBILINOGEN UA: 0.2 mg/dL (ref 0.0–1.0)
pH: 7 (ref 5.0–8.0)

## 2018-04-17 MED ORDER — ALBUTEROL SULFATE HFA 108 (90 BASE) MCG/ACT IN AERS: 2.0000 | INHALATION_SPRAY | RESPIRATORY_TRACT | 1 refills | Status: DC | PRN

## 2018-04-17 MED ORDER — CEPHALEXIN 500 MG PO CAPS: 500.0000 mg | ORAL_CAPSULE | Freq: Four times a day (QID) | ORAL | 0 refills | Status: DC

## 2018-04-17 NOTE — ED Triage Notes (Signed)
Pt. Stated, I have a UTI, urine frequency, painful urination. Fever and facial spasms started week ago.

## 2018-04-17 NOTE — Discharge Instructions (Signed)
Your urine on has some minor abnormalities. Because if your symptoms we are proceeding with treating you for a urinary infection and getting a urine culture. Final results of the latter test should be available in 2 days.  In the meantime I'm starting her on antibiotics and give you an inhaler. Please read the instructions for use of the inhaler.

## 2018-04-17 NOTE — ED Provider Notes (Signed)
[facility]   [id number] 04/17/18 Arrival Time: 1921   SUBJECTIVE:  Megan Ballard is a 37 y.o. female who presents to the urgent care with complaint of UTI, urine frequency, painful urination. Fever and facial spasms started week ago.  Patient also has seasonal wheezing and cough. She's been treated with Medrol Dosepak in the past successfully. She is also used Atrovent.  Patient did not have asthma as a child but seems to be developing this in her 30s. 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  Past Medical History:  Diagnosis Date  . Abnormal Pap smear 2006  . Anemia 2002  . Anxiety   . Bacterial infection   . H/O candidiasis   . H/O chlamydia infection 2010  . H/O cystitis 2012  . H/O pyelonephritis 2011  . H/O varicella   . History of GBS (group B streptococcus) UTI, currently pregnant 2002  . Hyperemesis   . Yeast infection    Family History  Problem Relation Age of Onset  . Mental illness Father   . Hypertension Maternal Aunt   . Diabetes Maternal Aunt   . Kidney disease Maternal Aunt   . Hypertension Maternal Uncle   . Kidney disease Maternal Uncle   . Alcohol abuse Maternal Uncle   . Heart disease Maternal Grandmother   . Hypertension Maternal Grandmother   . Diabetes Maternal Grandmother   . Kidney disease Maternal Grandmother   . Kidney disease Cousin   . Anesthesia problems Neg Hx    Social History   Socioeconomic History  . Marital status: Single    Spouse name: Not on file  . Number of children: Not on file  . Years of education: Not on file  . Highest education level: Not on file  Occupational History  . Not on file  Social Needs  . Financial resource strain: Not on file  . Food insecurity:    Worry: Not on file    Inability: Not on file  . Transportation needs:    Medical: Not on file    Non-medical: Not on file  Tobacco Use  . Smoking status: Never Smoker  . Smokeless tobacco: Never Used  Substance  and Sexual Activity  . Alcohol use: No  . Drug use: No  . Sexual activity: Yes    Birth control/protection: None  Lifestyle  . Physical activity:    Days per week: Not on file    Minutes per session: Not on file  . Stress: Not on file  Relationships  . Social connections:    Talks on phone: Not on file    Gets together: Not on file    Attends religious service: Not on file    Active member of club or organization: Not on file    Attends meetings of clubs or organizations: Not on file    Relationship status: Not on file  . Intimate partner violence:    Fear of current or ex partner: Not on file    Emotionally abused: Not on file    Physically abused: Not on file    Forced sexual activity: Not on file  Other Topics Concern  . Not on file  Social History Narrative  . Not on file   No outpatient medications have been marked as taking for the 04/17/18 encounter [facility] Encounter).   Allergies  Allergen Reactions  . Latex Rash      ROS: As per HPI, remainder of ROS negative.   OBJECTIVE:  Vitals:   04/17/18 1953 04/17/18 1954  BP: 125/78   Pulse: 64   Resp: 16   Temp: 98.7 F (37.1 C)   TempSrc: Oral   SpO2: 100%   Weight:  155 lb (70.3 kg)  Height:   (1.626 m)     General appearance: alert; no distress Eyes: PERRL; EOMI; conjunctiva normal HENT: normocephalic; atraumatic; TMs normal, canal normal, external ears normal without trauma; nasal mucosa normal; oral mucosa normal Neck: supple Lungs: Few faint bilateral wheezes Heart: regular rate and rhythm Back: no CVA tenderness Extremities: no cyanosis or edema; symmetrical with no gross deformities Skin: warm and dry Neurologic: normal gait; grossly normal Psychological: alert and cooperative; normal mood and affect      Labs:  Results for orders placed or performed during the hospital encounter of 04/17/18  POCT urinalysis dip (device)  Result Value Ref Range   Glucose, UA NEGATIVE NEGATIVE  mg/dL   Bilirubin Urine NEGATIVE NEGATIVE   Ketones, ur NEGATIVE NEGATIVE mg/dL   Specific Gravity, Urine 1.020 1.005 - 1.030   Hgb urine dipstick TRACE (A) NEGATIVE   pH 7.0 5.0 - 8.0   Protein, ur 100 (A) NEGATIVE mg/dL   Urobilinogen, UA 0.2 0.0 - 1.0 mg/dL   Nitrite NEGATIVE NEGATIVE   Leukocytes, UA NEGATIVE NEGATIVE    Labs Reviewed  POCT URINALYSIS DIP (DEVICE) - Abnormal; Notable for the following components:      Result Value   Hgb urine dipstick TRACE (*)    Protein, ur 100 (*)    All other components within normal limits  URINE CULTURE    No results found.     ASSESSMENT & PLAN:  1. Lower urinary tract infectious disease   2. Mild intermittent asthma with acute exacerbation   Your urine on has some minor abnormalities. Because if your symptoms we are proceeding with treating you for a urinary infection and getting a urine culture. Final results of the latter test should be available in 2 days.  In the meantime I'm starting her on antibiotics and give you an inhaler. Please read the instructions for use of the inhaler.     Meds ordered this encounter  Medications  . albuterol (PROVENTIL HFA;VENTOLIN HFA) 108 (90 Base) MCG/ACT inhaler    Sig: Inhale 2 puffs into the lungs every 4 (four) hours as needed for wheezing or shortness of breath (cough, shortness of breath or wheezing.).    Dispense:  1 Inhaler    Refill:  1  . cephALEXin (KEFLEX) 500 MG capsule    Sig: Take 1 capsule (500 mg total) by mouth 4 (four) times daily.    Dispense:  20 capsule    Refill:  0    Reviewed expectations re: course of current medical issues. Questions answered. Outlined signs and symptoms indicating need for more acute intervention. Patient verbalized understanding. After Visit Summary given.    Procedures:      Elvina Sidle, MD 04/17/18 2015

## 2018-04-17 NOTE — ED Notes (Signed)
Called and failed in the lobby x 1

## 2018-04-23 ENCOUNTER — Emergency Department (HOSPITAL_COMMUNITY)
Admission: EM | Admit: 2018-04-23 | Discharge: 2018-04-24 | Disposition: A | Discharge status: Home / Self Care | Payer: Medicaid Other | Attending: Emergency Medicine | Admitting: Emergency Medicine

## 2018-04-23 ENCOUNTER — Other Ambulatory Visit: Payer: Self-pay

## 2018-04-23 ENCOUNTER — Encounter (HOSPITAL_COMMUNITY): Payer: Self-pay | Admitting: Emergency Medicine

## 2018-04-23 DIAGNOSIS — R509 Fever, unspecified: Secondary | ICD-10-CM | POA: Insufficient documentation

## 2018-04-23 DIAGNOSIS — R51 Headache: Secondary | ICD-10-CM | POA: Insufficient documentation

## 2018-04-23 DIAGNOSIS — R110 Nausea: Secondary | ICD-10-CM | POA: Insufficient documentation

## 2018-04-23 DIAGNOSIS — Z5321 Procedure and treatment not carried out due to patient leaving prior to being seen by health care provider: Secondary | ICD-10-CM | POA: Insufficient documentation

## 2018-04-23 LAB — CBC
HCT: 36.1 % (ref 36.0–46.0)
HEMOGLOBIN: 12.1 g/dL (ref 12.0–15.0)
MCH: 28.3 pg (ref 26.0–34.0)
MCHC: 33.5 g/dL (ref 30.0–36.0)
MCV: 84.5 fL (ref 78.0–100.0)
Platelets: 247 10*3/uL (ref 150–400)
RBC: 4.27 MIL/uL (ref 3.87–5.11)
RDW: 13 % (ref 11.5–15.5)
WBC: 8.1 10*3/uL (ref 4.0–10.5)

## 2018-04-23 LAB — URINALYSIS, ROUTINE W REFLEX MICROSCOPIC
BILIRUBIN URINE: NEGATIVE
Glucose, UA: NEGATIVE mg/dL
KETONES UR: NEGATIVE mg/dL
LEUKOCYTES UA: NEGATIVE
Nitrite: NEGATIVE
PH: 5 (ref 5.0–8.0)
PROTEIN: 30 mg/dL — AB
Specific Gravity, Urine: 1.015 (ref 1.005–1.030)

## 2018-04-23 LAB — BASIC METABOLIC PANEL
Anion gap: 9 (ref 5–15)
BUN: 13 mg/dL (ref 6–20)
CHLORIDE: 107 mmol/L (ref 101–111)
CO2: 23 mmol/L (ref 22–32)
CREATININE: 0.81 mg/dL (ref 0.44–1.00)
Calcium: 9 mg/dL (ref 8.9–10.3)
GFR calc Af Amer: >60 mL/min (ref >60)
GFR calc non Af Amer: >60 mL/min (ref >60)
Glucose, Bld: 104 mg/dL — ABNORMAL HIGH (ref 65–99)
POTASSIUM: 3.7 mmol/L (ref 3.5–5.1)
Sodium: 139 mmol/L (ref 135–145)

## 2018-04-23 LAB — I-STAT BETA HCG BLOOD, ED (MC, WL, AP ONLY): I-stat hCG, quantitative: <5 m[IU]/mL (ref <5)

## 2018-04-23 NOTE — ED Triage Notes (Signed)
Pt reports headache and nausea all day today, reports actively being treated for UTI. Subjective fever, did not take her temperature but reports she felt hot. Also "eye jumping" for two weeks.

## 2018-04-24 NOTE — ED Notes (Signed)
Pt told staff she was leaving

## 2018-04-24 NOTE — ED Notes (Signed)
Follow up call made  Pt states she is going to urgent care     1255   04/24/18   s Daishon Chui rn

## 2018-07-28 ENCOUNTER — Encounter (HOSPITAL_COMMUNITY): Payer: Self-pay

## 2018-07-28 ENCOUNTER — Ambulatory Visit (HOSPITAL_COMMUNITY)
Admission: EM | Admit: 2018-07-28 | Discharge: 2018-07-28 | Disposition: A | Discharge status: Home / Self Care | Payer: Self-pay | Attending: Internal Medicine | Admitting: Internal Medicine

## 2018-07-28 ENCOUNTER — Ambulatory Visit (INDEPENDENT_AMBULATORY_CARE_PROVIDER_SITE_OTHER): Payer: Self-pay

## 2018-07-28 ENCOUNTER — Other Ambulatory Visit: Payer: Self-pay

## 2018-07-28 DIAGNOSIS — M25561 Pain in right knee: Secondary | ICD-10-CM

## 2018-07-28 MED ORDER — IBUPROFEN 800 MG PO TABS: 800.0000 mg | ORAL_TABLET | Freq: Three times a day (TID) | ORAL | 0 refills | Status: DC
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
## 2018-07-28 NOTE — Discharge Instructions (Signed)
Use anti-inflammatories for pain/swelling. You may take up to 800 mg Ibuprofen every 8 hours with food. You may supplement Ibuprofen with Tylenol [phone] mg every 8 hours.   Ice, elevate and rest knee to help with swelling  Follow-up with orthopedics in 1 to 2 weeks if not having any improvement of pain  Please return here or go to emergency room if developing increased swelling, redness or pain in your upper leg

## 2019-02-11 ENCOUNTER — Ambulatory Visit (HOSPITAL_COMMUNITY)
Admission: EM | Admit: 2019-02-11 | Discharge: 2019-02-11 | Disposition: A | Discharge status: Home / Self Care | Payer: Medicaid Other | Attending: Family Medicine | Admitting: Family Medicine

## 2019-02-11 ENCOUNTER — Encounter (HOSPITAL_COMMUNITY): Payer: Self-pay

## 2019-02-11 ENCOUNTER — Other Ambulatory Visit: Payer: Self-pay

## 2019-02-11 DIAGNOSIS — R319 Hematuria, unspecified: Secondary | ICD-10-CM | POA: Diagnosis present

## 2019-02-11 DIAGNOSIS — N390 Urinary tract infection, site not specified: Secondary | ICD-10-CM | POA: Diagnosis not present

## 2019-02-11 LAB — POCT URINALYSIS DIP (DEVICE)
Bilirubin Urine: NEGATIVE
Glucose, UA: NEGATIVE mg/dL
Ketones, ur: NEGATIVE mg/dL
NITRITE: NEGATIVE
Protein, ur: 100 mg/dL — AB
Specific Gravity, Urine: 1.015 (ref 1.005–1.030)
Urobilinogen, UA: 1 mg/dL (ref 0.0–1.0)
pH: 6 (ref 5.0–8.0)

## 2019-02-11 LAB — POCT PREGNANCY, URINE: PREG TEST UR: NEGATIVE

## 2019-02-11 MED ORDER — PHENAZOPYRIDINE HCL 200 MG PO TABS: 200.0000 mg | ORAL_TABLET | Freq: Three times a day (TID) | ORAL | 0 refills | Status: DC

## 2019-02-11 MED ORDER — NITROFURANTOIN MONOHYD MACRO 100 MG PO CAPS: 100.0000 mg | ORAL_CAPSULE | Freq: Two times a day (BID) | ORAL | 0 refills | Status: DC

## 2019-02-11 NOTE — ED Triage Notes (Signed)
Pt she has blood in her urine she states has no energy. X 4 days

## 2019-02-11 NOTE — ED Provider Notes (Signed)
[facility]   CC: Blood in urine   SUBJECTIVE:  Megan Ballard is a 38 y.o. female who complains of blood in urine that began this morning.  Reports recent stomach flu this past week, and states she may not have been adequately hydrated at that time. Reports mild ache over suprapubic region.  Has NOT tried OTC medications.  Admits to similar symptoms in the past and diagnosed with UTI.  Complains of associated nausea, and fatigue.  Denies fever, chills, vomiting, flank pain, abnormal vaginal discharge or bleeding.  LMP: Patient's last menstrual period was 01/29/2019.  Not on BC.  Last sex 2 weeks ago.    ROS: As in HPI.  Past Medical History:  Diagnosis Date  . Abnormal Pap smear 2006  . Anemia 2002  . Anxiety   . Bacterial infection   . H/O candidiasis   . H/O chlamydia infection 2010  . H/O cystitis 2012  . H/O pyelonephritis 2011  . H/O varicella   . History of GBS (group B streptococcus) UTI, currently pregnant 2002  . Hyperemesis   . Yeast infection    Past Surgical History:  Procedure Laterality Date  . INDUCED ABORTION  2011  . NO PAST SURGERIES     Allergies  Allergen Reactions  . Latex Rash   No current facility-administered medications on file prior to encounter.    Current Outpatient Medications on File Prior to Encounter  Medication Sig Dispense Refill  . albuterol (PROVENTIL HFA;VENTOLIN HFA) 108 (90 Base) MCG/ACT inhaler Inhale 2 puffs into the lungs every 4 (four) hours as needed for wheezing or shortness of breath (cough, shortness of breath or wheezing.). 1 Inhaler 1  . ibuprofen (ADVIL,MOTRIN) 800 MG tablet Take 1 tablet (800 mg total) by mouth 3 (three) times daily. 21 tablet 0   Social History   Socioeconomic History  . Marital status: Single    Spouse name: Not on file  . Number of children: Not on file  . Years of education: Not on file  . Highest education level: Not on file  Occupational History  . Not on file  Social  Needs  . Financial resource strain: Not on file  . Food insecurity:    Worry: Not on file    Inability: Not on file  . Transportation needs:    Medical: Not on file    Non-medical: Not on file  Tobacco Use  . Smoking status: Never Smoker  . Smokeless tobacco: Never Used  Substance and Sexual Activity  . Alcohol use: No  . Drug use: No  . Sexual activity: Yes    Birth control/protection: None  Lifestyle  . Physical activity:    Days per week: Not on file    Minutes per session: Not on file  . Stress: Not on file  Relationships  . Social connections:    Talks on phone: Not on file    Gets together: Not on file    Attends religious service: Not on file    Active member of club or organization: Not on file    Attends meetings of clubs or organizations: Not on file    Relationship status: Not on file  . Intimate partner violence:    Fear of current or ex partner: Not on file    Emotionally abused: Not on file    Physically abused: Not on file    Forced sexual activity: Not on file  Other Topics Concern  . Not on file  Social  History Narrative  . Not on file   Family History  Problem Relation Age of Onset  . Mental illness Father   . Hypertension Maternal Aunt   . Diabetes Maternal Aunt   . Kidney disease Maternal Aunt   . Hypertension Maternal Uncle   . Kidney disease Maternal Uncle   . Alcohol abuse Maternal Uncle   . Heart disease Maternal Grandmother   . Hypertension Maternal Grandmother   . Diabetes Maternal Grandmother   . Kidney disease Maternal Grandmother   . Kidney disease Cousin   . Anesthesia problems Neg Hx     OBJECTIVE:  Vitals:   02/11/19 0949 02/11/19 0950  BP: 133/79   Pulse: 63   Resp: 18   Temp: 98.5 F (36.9 C)   TempSrc: Tympanic   SpO2: 100%   Weight:  155 lb (70.3 kg)   General appearance: Alert in no acute distress HEENT: NCAT.  Oropharynx clear.  Lungs: clear to auscultation bilaterally without adventitious breath  sounds Heart: regular rate and rhythm.  Radial pulses 2+ symmetrical bilaterally Abdomen: soft; non-distended; mildly TTP over suprapubic region; bowel sounds present; no guarding or rebound tenderness Back: no CVA tenderness Extremities: no edema; symmetrical with no gross deformities Skin: warm and dry Neurologic: Ambulates from chair to exam table without difficulty Psychological: alert and cooperative; normal mood and affect  Labs Reviewed  POCT URINALYSIS DIP (DEVICE) - Abnormal; Notable for the following components:      Result Value   Hgb urine dipstick MODERATE (*)    Protein, ur 100 (*)    Leukocytes,Ua SMALL (*)    All other components within normal limits  POC URINE PREG, ED  POCT PREGNANCY, URINE    ASSESSMENT & PLAN:  1. Lower urinary tract infectious disease   2. Hematuria, unspecified type     Meds ordered this encounter  Medications  . nitrofurantoin, macrocrystal-monohydrate, (MACROBID) 100 MG capsule    Sig: Take 1 capsule (100 mg total) by mouth 2 (two) times daily.    Dispense:  10 capsule    Refill:  0    Order Specific Question:   Supervising Provider    Answer:   Eustace Moore [[id number]]  . phenazopyridine (PYRIDIUM) 200 MG tablet    Sig: Take 1 tablet (200 mg total) by mouth 3 (three) times daily.    Dispense:  6 tablet    Refill:  0    Order Specific Question:   Supervising Provider    Answer:   Eustace Moore [[id number]]   Urine showed blood and white blood cells.  This could be a sign of infection Urine culture sent.  We will call you with abnormal results.   Push fluids and get plenty of rest.   Take antibiotic as directed and to completion Take pyridium as prescribed and as needed for symptomatic relief Follow up with PCP or with [facility] if symptoms persists Return here or go to ER if you have any new or worsening symptoms such as fever, chills, nausea, vomiting, worsening abdominal pain, nausea/vomiting, flank pain,  etc...  Outlined signs and symptoms indicating need for more acute intervention. Patient verbalized understanding. After Visit Summary given.     Rennis Harding, PA-C 02/11/19 1045

## 2019-02-11 NOTE — Discharge Instructions (Addendum)
Urine pregnancy negative Urine showed blood and white blood cells.  This could be a sign of infection Urine culture sent.  We will call you with abnormal results.   Push fluids and get plenty of rest.   Take antibiotic as directed and to completion Take pyridium as prescribed and as needed for symptomatic relief Follow up with PCP or with [facility] if symptoms persists Return here or go to ER if you have any new or worsening symptoms such as fever, chills, nausea, vomiting, worsening abdominal pain, nausea/vomiting, flank pain, etc..Marland Kitchen

## 2019-02-12 LAB — URINE CULTURE

## 2019-03-16 IMAGING — US US OB TRANSVAGINAL
1 series · 15 of 28 positions shown · non-contrast
Comparison: None.

CLINICAL DATA: Initial evaluation for persistent vomiting,
abdominal pain.

EXAM:
OBSTETRIC <14 WK US AND TRANSVAGINAL OB US
TECHNIQUE: Both transabdominal and transvaginal ultrasound examinations were
performed for complete evaluation of the gestation as well as the
maternal uterus, adnexal regions, and pelvic cul-de-sac.
Transvaginal technique was performed to assess early pregnancy.

[Series 1: us ob transvaginal · 15 of 71 slices shown]
[im 1/71]
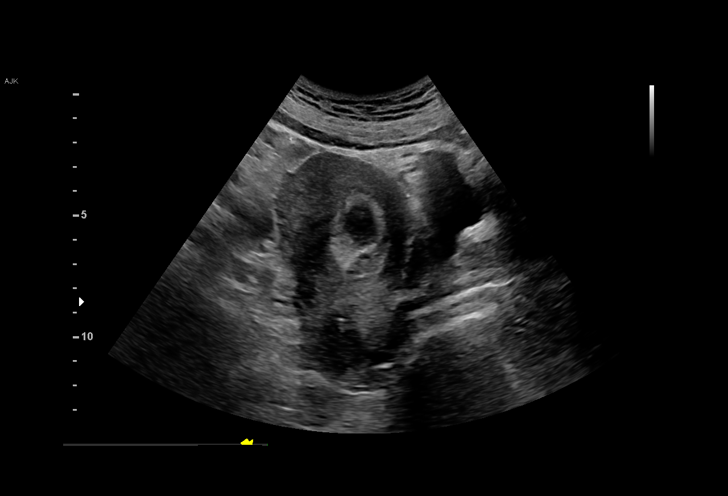
[im 6/71]
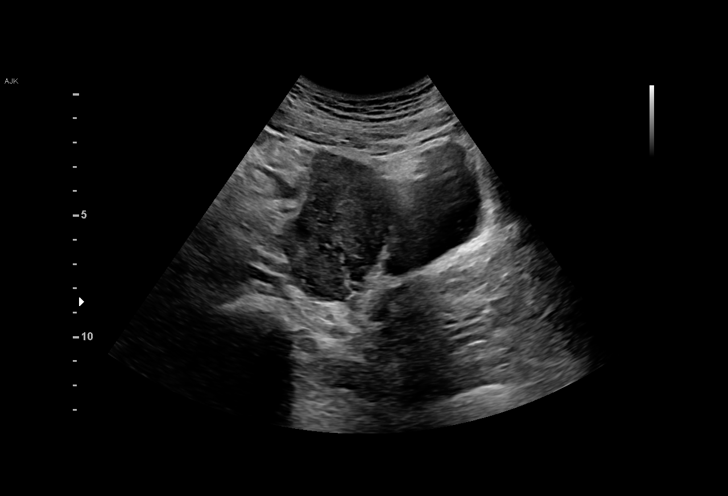
[im 11/71]
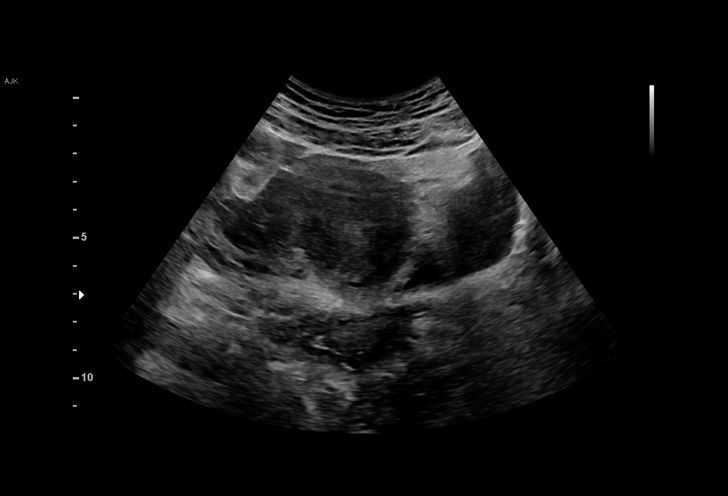
[im 16/71]
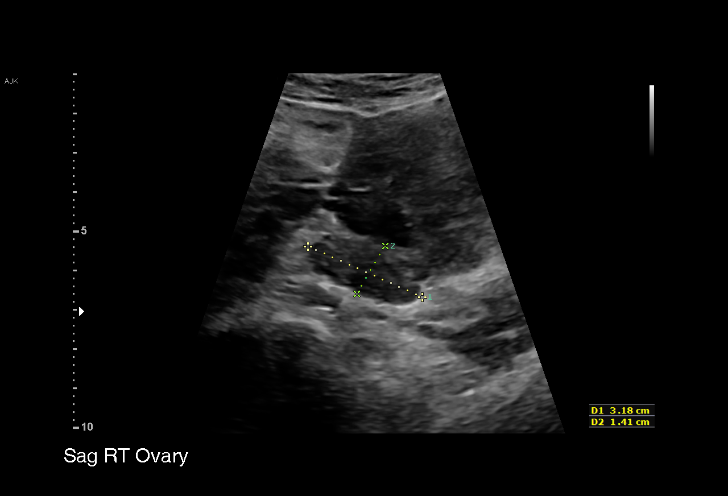
[im 21/71]
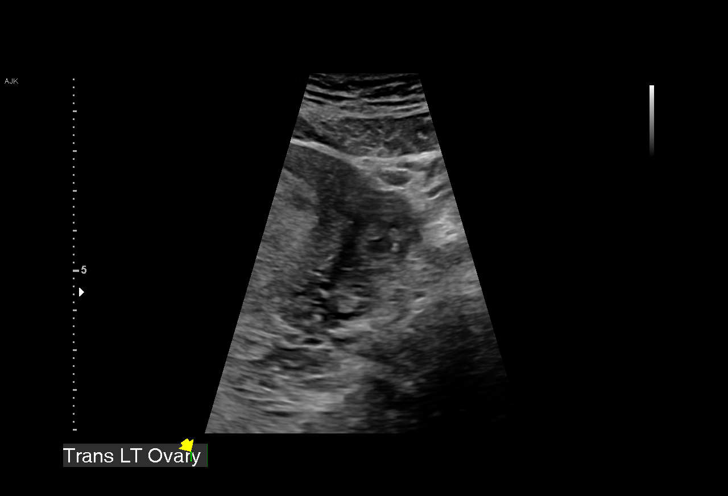
[im 26/71]
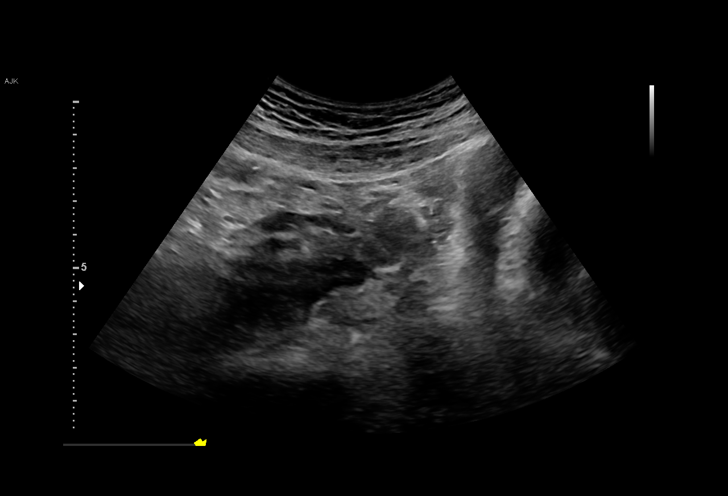
[im 32/71]
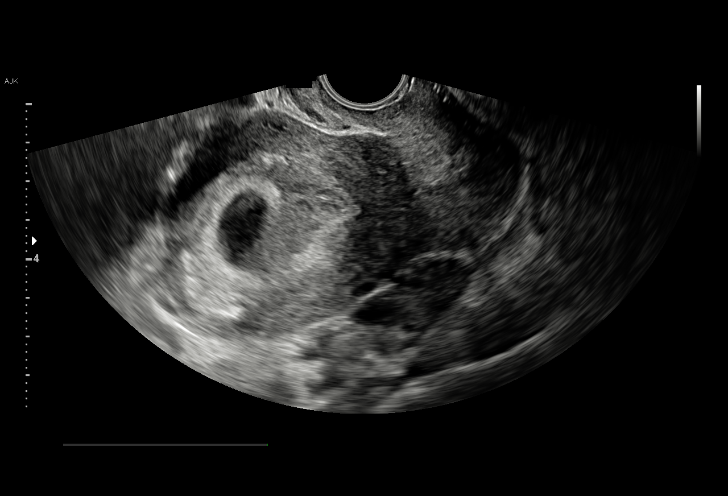
[im 37/71]
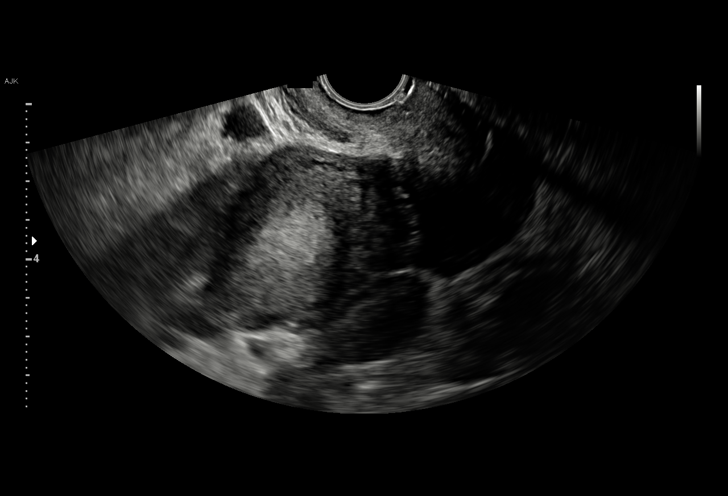
[im 39/71]
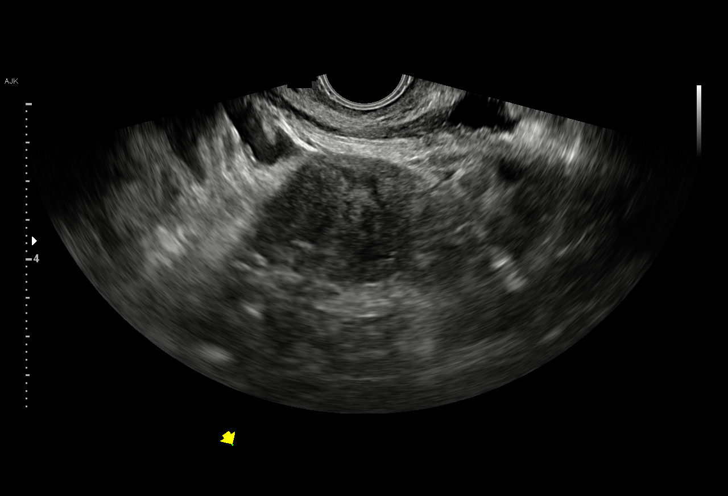
[im 45/71]
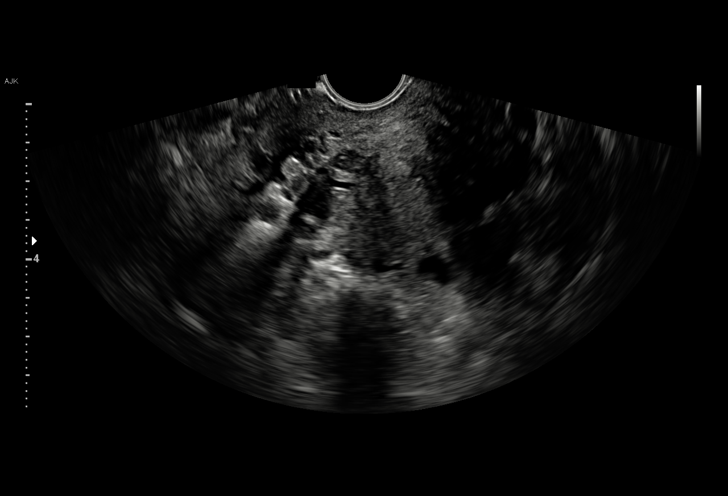
[im 50/71]
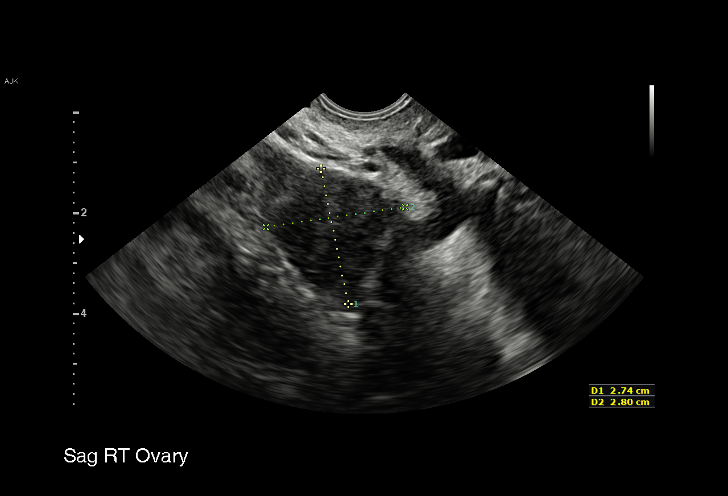
[im 55/71]
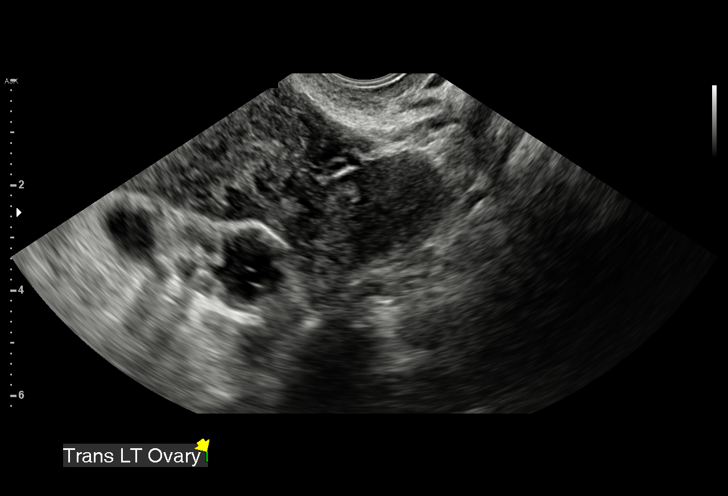
[im 60/71]
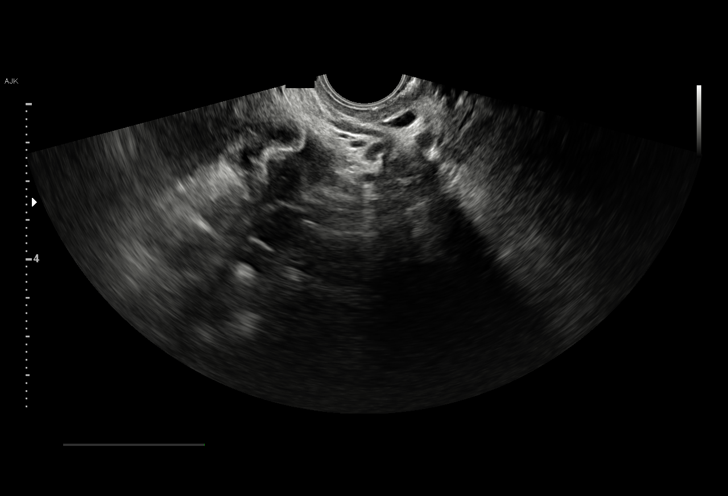
[im 65/71]
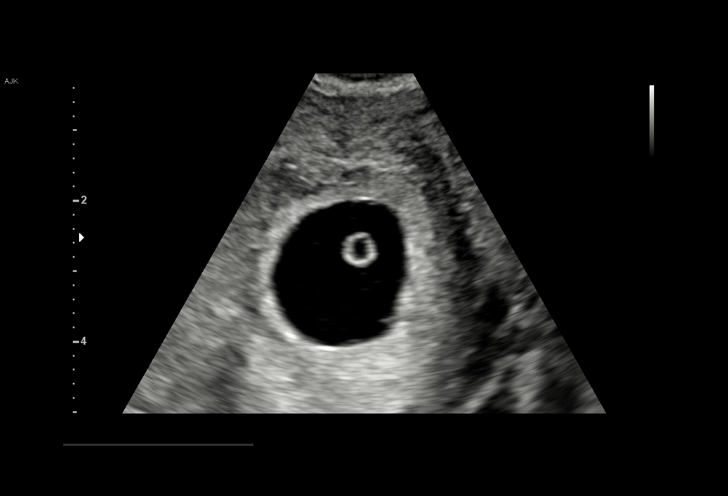
[im 71/71]
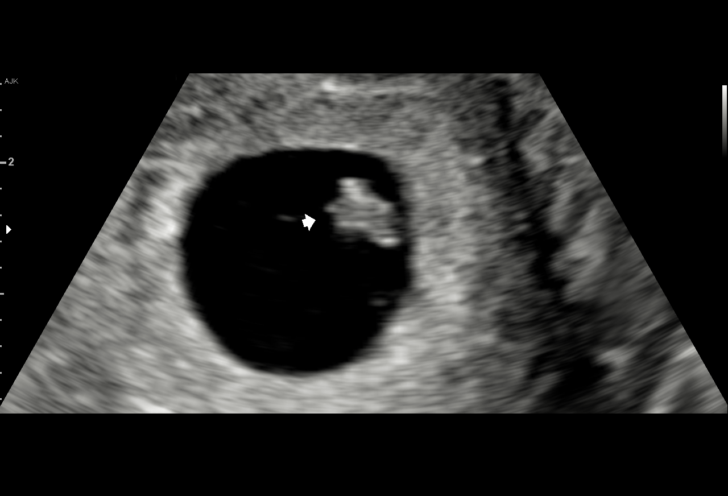

[15 of 28 positions shown; findings below may reference images not displayed]

FINDINGS: Intrauterine gestational sac: Single

Yolk sac:  Present

Embryo:  Present

Cardiac Activity: Present

Heart Rate: 123  bpm

CRL:  7.0  mm   6 w   3 d                  US EDC: 04/14/2018

Subchorionic hemorrhage:  None visualized.

Maternal uterus/adnexae: Ovaries well visualized within the adnexa
bilaterally. Small left ovarian corpus luteal cyst noted. No adnexal
mass. No free fluid.
IMPRESSION: 1. Single viable intrauterine pregnancy as above without
complication. Estimated gestational age 6 weeks and 3 days by
sonography.
2. No other acute maternal uterine or adnexal abnormality
identified.

## 2019-09-03 ENCOUNTER — Other Ambulatory Visit: Payer: Self-pay

## 2019-09-03 ENCOUNTER — Encounter (HOSPITAL_COMMUNITY): Payer: Self-pay | Admitting: Emergency Medicine

## 2019-09-03 ENCOUNTER — Ambulatory Visit (HOSPITAL_COMMUNITY)
Admission: EM | Admit: 2019-09-03 | Discharge: 2019-09-03 | Disposition: A | Discharge status: Home / Self Care | Payer: Medicaid Other | Attending: Urgent Care | Admitting: Urgent Care

## 2019-09-03 DIAGNOSIS — F419 Anxiety disorder, unspecified: Secondary | ICD-10-CM

## 2019-09-03 DIAGNOSIS — F329 Major depressive disorder, single episode, unspecified: Secondary | ICD-10-CM

## 2019-09-03 DIAGNOSIS — Z638 Other specified problems related to primary support group: Secondary | ICD-10-CM

## 2019-09-03 MED ORDER — ESCITALOPRAM OXALATE 10 MG PO TABS: 10.0000 mg | ORAL_TABLET | Freq: Every day | ORAL | 0 refills | Status: DC

## 2019-09-03 NOTE — ED Provider Notes (Signed)
MRN: [id number] DOB: 06/26/1981  Subjective:   Megan Ballard is a 38 y.o. female presenting for ongoing persistent recurrent worsening anxiety and depression.  Patient states that she has had increasing life stressors at home with multiple family members including her oldest daughter having just given birth to patient's first granddaughter.  They are living at home with her as well as patient's boyfriend and 2 more children.  Patient's boyfriend also has 2 adult children of his own who come and go throughout the week unannounced.  Patient has a very difficult time keeping up with a home as she feels that no one contributes.  She is also having stressors with work and finances.  She states that at this point she feels like she does not care about taking care of her self her home and since no one else has contributed, reports that the home is falling apart.  She feels like her mind is constantly worrying and stressing and does not know how to slow down.  She would like recommendations about where to get mental health services.  She has a history of severe depression about 15 to 20 years ago and responded well to Lexapro and therapy.  She would like to try this again.  Currently she denies any kind of suicidal ideation, homicidal ideation.  She has never attempted to harm herself, has never attempted suicide.  Denies family history of same.   No current facility-administered medications for this encounter.   Current Outpatient Medications:  .  ibuprofen (ADVIL,MOTRIN) 800 MG tablet, Take 1 tablet (800 mg total) by mouth 3 (three) times daily., Disp: 21 tablet, Rfl: 0 .  phenazopyridine (PYRIDIUM) 200 MG tablet, Take 1 tablet (200 mg total) by mouth 3 (three) times daily., Disp: 6 tablet, Rfl: 0    Allergies  Allergen Reactions  . Latex Rash    Past Medical History:  Diagnosis Date  . Abnormal Pap smear 2006  . Anemia 2002  . Anxiety   . Bacterial infection   . H/O candidiasis   . H/O  chlamydia infection 2010  . H/O cystitis 2012  . H/O pyelonephritis 2011  . H/O varicella   . History of GBS (group B streptococcus) UTI, currently pregnant 2002  . Hyperemesis   . Yeast infection      Past Surgical History:  Procedure Laterality Date  . INDUCED ABORTION  2011  . NO PAST SURGERIES      ROS  Objective:   Vitals: BP 102/76 (BP Location: Left Arm)   Pulse 77   Temp 97.8 F (36.6 C) (Oral)   Resp 18   LMP 08/05/2019   SpO2 98%   Physical Exam Constitutional:      General: She is not in acute distress.    Appearance: Normal appearance. She is well-developed. She is not ill-appearing.  HENT:     Head: Normocephalic and atraumatic.     Nose: Nose normal.     Mouth/Throat:     Mouth: Mucous membranes are moist.     Pharynx: Oropharynx is clear.  Eyes:     General: No scleral icterus.    Extraocular Movements: Extraocular movements intact.     Pupils: Pupils are equal, round, and reactive to light.  Cardiovascular:     Rate and Rhythm: Normal rate.  Pulmonary:     Effort: Pulmonary effort is normal.  Skin:    General: Skin is warm and dry.  Neurological:     General: No focal deficit present.  Mental Status: She is alert and oriented to person, place, and time.  Psychiatric:        Mood and Affect: Mood is depressed. Mood is not anxious or elated. Affect is flat. Affect is not labile, blunt, angry, tearful or inappropriate.        Speech: She is communicative (But does have monotone speech). Speech is not rapid and pressured, delayed, slurred or tangential.        Behavior: Behavior normal. Behavior is not agitated, slowed, aggressive, withdrawn, hyperactive or combative.        Thought Content: Thought content does not include homicidal or suicidal ideation.        Cognition and Memory: Cognition is not impaired. Memory is not impaired. She does not exhibit impaired recent memory or impaired remote memory.        Judgment: Judgment is not impulsive  or inappropriate.     Assessment and Plan :   1. Anxiety and depression   2. Stress due to family tension     Patient is very self-aware, lucid in her thoughts.  She is not high risk for suicide.  She is depressed and has a lot of stressors at the source of this mostly to do with her family.  We discussed need for behavioral therapy and establishing care with a PCP for refill of her maintenance medication, I will start her on Lexapro today.  She is to do half tablet of 10 mg Lexapro and increase to the full tablet in 2 to 4 weeks. Counseled patient on potential for adverse effects with medications prescribed/recommended today, ER and return-to-clinic precautions discussed, patient verbalized understanding.    Jaynee Eagles, [place] 09/03/19 1115

## 2019-09-03 NOTE — ED Triage Notes (Signed)
Patient states concerns for anxiety and depression.  Patient has a history of anxiety and depression.  Patient does not have a pcp.  Patient doesn't want to do anything, doesn't want to get up.  Currently out of work

## 2020-01-08 ENCOUNTER — Encounter (HOSPITAL_COMMUNITY): Payer: Self-pay | Admitting: Emergency Medicine

## 2020-01-08 ENCOUNTER — Other Ambulatory Visit: Payer: Self-pay

## 2020-01-08 ENCOUNTER — Ambulatory Visit (HOSPITAL_COMMUNITY)
Admission: EM | Admit: 2020-01-08 | Discharge: 2020-01-08 | Disposition: A | Discharge status: Home / Self Care | Payer: Medicaid Other | Attending: Family Medicine | Admitting: Family Medicine

## 2020-01-08 DIAGNOSIS — Z1152 Encounter for screening for COVID-19: Secondary | ICD-10-CM | POA: Diagnosis present

## 2020-01-08 DIAGNOSIS — Z20822 Contact with and (suspected) exposure to covid-19: Secondary | ICD-10-CM

## 2020-01-08 NOTE — ED Triage Notes (Signed)
Pt here for covid testing with possible exposure; no sx noted 

## 2020-01-08 NOTE — Discharge Instructions (Addendum)
You have been tested for COVID-19 today. °If your test returns positive, you will receive a phone call from [place] regarding your results. °Negative test results are not called. °Both positive and negative results area always visible on MyChart. °If you do not have a MyChart account, sign up instructions are provided in your discharge papers. °Please do not hesitate to contact us should you have questions or concerns. ° °

## 2020-01-10 LAB — NOVEL CORONAVIRUS, NAA (HOSP ORDER, SEND-OUT TO REF LAB; TAT 18-24 HRS): SARS-CoV-2, NAA: NOT DETECTED

## 2020-01-13 NOTE — ED Provider Notes (Signed)
[facility]   [id number] 01/08/20 Arrival Time: 1952  ASSESSMENT & PLAN:  1. Encounter for screening for COVID-19   2. Exposure to COVID-19 virus      COVID-19 testing sent. See letter/work note on file for self-isolation guidelines.   Follow-up Information    [facility].   Specialty: Urgent Care Why: As needed. Contact information: [address] [place] [address] [phone]          Reviewed expectations re: course of current medical issues. Questions answered. Outlined signs and symptoms indicating need for more acute intervention. Patient verbalized understanding. After Visit Summary given.   SUBJECTIVE: History from: patient. Megan Ballard is a 39 y.o. female who requests COVID-19 testing. Known COVID-19 contact: reports exposure. Recent travel: none. Denies: runny nose, congestion, fever, cough, sore throat, difficulty breathing and headache. Normal PO intake without n/v/d.    OBJECTIVE:  Vitals:   01/08/20 2023  BP: 123/76  Pulse: 65  Resp: 16  Temp: 98.3 F (36.8 C)  TempSrc: Oral  SpO2: 100%    General appearance: alert; no distress Eyes: PERRLA; EOMI; conjunctiva normal HENT: [place]; AT; nasal mucosa normal; oral mucosa normal Neck: supple  Lungs: speaks full sentences without difficulty; unlabored Extremities: no edema Skin: warm and dry Neurologic: normal gait Psychological: alert and cooperative; normal mood and affect  Labs:  Labs Reviewed  NOVEL CORONAVIRUS, NAA (HOSP ORDER, SEND-OUT TO REF LAB; TAT 18-24 HRS)      Allergies  Allergen Reactions  . Latex Rash    Past Medical History:  Diagnosis Date  . Abnormal Pap smear 2006  . Anemia 2002  . Anxiety   . Bacterial infection   . H/O candidiasis   . H/O chlamydia infection 2010  . H/O cystitis 2012  . H/O pyelonephritis 2011  . H/O varicella   . History of GBS (group B streptococcus) UTI, currently  pregnant 2002  . Hyperemesis   . Yeast infection    Social History   Socioeconomic History  . Marital status: Single    Spouse name: Not on file  . Number of children: Not on file  . Years of education: Not on file  . Highest education level: Not on file  Occupational History  . Not on file  Tobacco Use  . Smoking status: Never Smoker  . Smokeless tobacco: Never Used  Substance and Sexual Activity  . Alcohol use: No  . Drug use: No  . Sexual activity: Yes    Birth control/protection: None  Other Topics Concern  . Not on file  Social History Narrative  . Not on file   Social Determinants of Health   Financial Resource Strain:   . Difficulty of Paying Living Expenses: Not on file  Food Insecurity:   . Worried About Programme researcher, broadcasting/film/video in the Last Year: Not on file  . Ran Out of Food in the Last Year: Not on file  Transportation Needs:   . Lack of Transportation (Medical): Not on file  . Lack of Transportation (Non-Medical): Not on file  Physical Activity:   . Days of Exercise per Week: Not on file  . Minutes of Exercise per Session: Not on file  Stress:   . Feeling of Stress : Not on file  Social Connections:   . Frequency of Communication with Friends and Family: Not on file  . Frequency of Social Gatherings with Friends and Family: Not on file  . Attends  Religious Services: Not on file  . Active Member of Clubs or Organizations: Not on file  . Attends Archivist Meetings: Not on file  . Marital Status: Not on file  Intimate Partner Violence:   . Fear of Current or Ex-Partner: Not on file  . Emotionally Abused: Not on file  . Physically Abused: Not on file  . Sexually Abused: Not on file   Family History  Problem Relation Age of Onset  . Mental illness Father   . Hypertension Maternal Aunt   . Diabetes Maternal Aunt   . Kidney disease Maternal Aunt   . Hypertension Maternal Uncle   . Kidney disease Maternal Uncle   . Alcohol abuse Maternal Uncle    . Heart disease Maternal Grandmother   . Hypertension Maternal Grandmother   . Diabetes Maternal Grandmother   . Kidney disease Maternal Grandmother   . Kidney disease Cousin   . Anesthesia problems Neg Hx    Past Surgical History:  Procedure Laterality Date  . INDUCED ABORTION  2011  . NO PAST SURGERIES       Vanessa Kick, MD 01/13/20 [phone]

## 2020-03-11 ENCOUNTER — Other Ambulatory Visit: Payer: Self-pay

## 2020-03-11 ENCOUNTER — Ambulatory Visit (HOSPITAL_COMMUNITY)
Admission: EM | Admit: 2020-03-11 | Discharge: 2020-03-11 | Disposition: A | Discharge status: Home / Self Care | Payer: Medicaid Other | Attending: Family Medicine | Admitting: Family Medicine

## 2020-03-11 ENCOUNTER — Encounter (HOSPITAL_COMMUNITY): Payer: Self-pay

## 2020-03-11 DIAGNOSIS — D573 Sickle-cell trait: Secondary | ICD-10-CM | POA: Insufficient documentation

## 2020-03-11 DIAGNOSIS — Z3202 Encounter for pregnancy test, result negative: Secondary | ICD-10-CM

## 2020-03-11 DIAGNOSIS — N390 Urinary tract infection, site not specified: Secondary | ICD-10-CM

## 2020-03-11 DIAGNOSIS — Z79899 Other long term (current) drug therapy: Secondary | ICD-10-CM | POA: Diagnosis not present

## 2020-03-11 DIAGNOSIS — R509 Fever, unspecified: Secondary | ICD-10-CM | POA: Diagnosis not present

## 2020-03-11 DIAGNOSIS — U071 COVID-19: Secondary | ICD-10-CM | POA: Diagnosis not present

## 2020-03-11 DIAGNOSIS — Z91040 Latex allergy status: Secondary | ICD-10-CM | POA: Diagnosis not present

## 2020-03-11 DIAGNOSIS — Z833 Family history of diabetes mellitus: Secondary | ICD-10-CM | POA: Insufficient documentation

## 2020-03-11 DIAGNOSIS — R300 Dysuria: Secondary | ICD-10-CM | POA: Diagnosis present

## 2020-03-11 DIAGNOSIS — Z87440 Personal history of urinary (tract) infections: Secondary | ICD-10-CM | POA: Diagnosis not present

## 2020-03-11 DIAGNOSIS — Z8249 Family history of ischemic heart disease and other diseases of the circulatory system: Secondary | ICD-10-CM | POA: Diagnosis not present

## 2020-03-11 DIAGNOSIS — R319 Hematuria, unspecified: Secondary | ICD-10-CM | POA: Insufficient documentation

## 2020-03-11 LAB — POCT URINALYSIS DIP (DEVICE)
Bilirubin Urine: NEGATIVE
Glucose, UA: NEGATIVE mg/dL
Ketones, ur: NEGATIVE mg/dL
Leukocytes,Ua: NEGATIVE
Nitrite: NEGATIVE
Protein, ur: 30 mg/dL — AB
Specific Gravity, Urine: 1.015 (ref 1.005–1.030)
Urobilinogen, UA: 0.2 mg/dL (ref 0.0–1.0)
pH: 7 (ref 5.0–8.0)

## 2020-03-11 LAB — POCT PREGNANCY, URINE: Preg Test, Ur: NEGATIVE

## 2020-03-11 LAB — POC URINE PREG, ED: Preg Test, Ur: NEGATIVE

## 2020-03-11 MED ORDER — ONDANSETRON 4 MG PO TBDP: 4.0000 mg | ORAL_TABLET | Freq: Three times a day (TID) | ORAL | 0 refills | Status: DC | PRN

## 2020-03-11 MED ORDER — FLUCONAZOLE 150 MG PO TABS: 150.0000 mg | ORAL_TABLET | Freq: Once | ORAL | 0 refills | Status: AC

## 2020-03-11 MED ORDER — IBUPROFEN 800 MG PO TABS: 800.0000 mg | ORAL_TABLET | Freq: Three times a day (TID) | ORAL | 0 refills | Status: DC

## 2020-03-11 MED ORDER — CEPHALEXIN 500 MG PO CAPS: 500.0000 mg | ORAL_CAPSULE | Freq: Two times a day (BID) | ORAL | 0 refills | Status: AC

## 2020-03-11 NOTE — ED Triage Notes (Signed)
Pt c/o urinary frequency/urgency and foul odor of urine for approx 3 weeks. C/o bilat back/flank pain, abdom pain, nausea and chills onset yesterday.  Denies vomiting, diarrhea.  Last dose 200mg  ibuprofen this morning.

## 2020-03-11 NOTE — Discharge Instructions (Addendum)
Please begin keflex twice daily for the next week Use anti-inflammatories for pain/swelling. You may take up to 800 mg Ibuprofen every 8 hours with food. You may supplement Ibuprofen with Tylenol [phone] mg every 8 hours.  Rest and fluids COVID swab, Vaginal Swab and urine culture pending- based off these results we will call and alter treatment as needed  Please follow up if any symptoms not improving or worsening

## 2020-03-11 NOTE — ED Provider Notes (Signed)
[facility]    CSN: [id number] Arrival date & time: 03/11/20  1850      History   Chief Complaint Chief Complaint  Patient presents with  . Dysuria    HPI Megan Ballard is a 39 y.o. female history of prior UTIs, presenting today for evaluation of fever, nausea and back pain.  Patient notes over the past 3 weeks she has had urinary odor and urinary frequency.  Denies dysuria, but has had some urgency.  Over the past 24 hours she has developed low-grade fevers as well as feeling nauseous and some low back pain.  She denies any significant URI symptoms, has had some mucus/postnasal drainage, but denies rhinorrhea cough or sore throat.  Denies any close sick contacts.  She has had some vaginal discharge, but denies associating itching or irritation.  Is in monogamous relationship with long-term partner, denies concerns for STDs.  Last menstrual cycle was around 4/2.  Not on birth control.  Bowels have been looser than normal, but denies diarrhea.  Denies significant abdominal pain.   HPI  Past Medical History:  Diagnosis Date  . Abnormal Pap smear 2006  . Anemia 2002  . Anxiety   . Bacterial infection   . H/O candidiasis   . H/O chlamydia infection 2010  . H/O cystitis 2012  . H/O pyelonephritis 2011  . H/O varicella   . History of GBS (group B streptococcus) UTI, currently pregnant 2002  . Hyperemesis   . Yeast infection     Patient Active Problem List   Diagnosis Date Noted  . UTI (lower urinary tract infection) postpartum 09/06/2012  . Likely kidney stone 9/13. 09/04/2012  . SVD (spontaneous vaginal delivery) 07/09/2012  . History of macrosomia in infant in prior pregnancy, currently pregnant 07/08/2012  . History of pyelonephritis 07/08/2012  . H/O abnormal Pap smear 07/08/2012  . Sickle cell trait (HCC) 01/19/2012  . Anemia 01/19/2012  . Ptyalism 01/19/2012  . 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  INDUCED ABORTION  2011  . NO PAST SURGERIES      OB History    Gravida  5   Para  2   Term  2   Preterm  0   AB  2   Living  2     SAB  1   TAB  0   Ectopic  0   Multiple  0   Live Births  1            Home Medications    Prior to Admission medications   Medication Sig Start Date End Date Taking? Authorizing Provider  escitalopram (LEXAPRO) 10 MG tablet Take 1 tablet (10 mg total) by mouth daily. 09/03/19  Yes Wallis Bamberg, PA-C  cephALEXin (KEFLEX) 500 MG capsule Take 1 capsule (500 mg total) by mouth 2 (two) times daily for 7 days. 03/11/20 03/18/20  Thu Baggett C, PA-C  fluconazole (DIFLUCAN) 150 MG tablet Take 1 tablet (150 mg total) by mouth once for 1 dose. 03/11/20 03/11/20  Ransom Nickson C, PA-C  ibuprofen (ADVIL) 800 MG tablet Take 1 tablet (800 mg total) by mouth 3 (three) times daily. 03/11/20   Itzamar Traynor C, PA-C  ondansetron (ZOFRAN ODT) 4 MG disintegrating tablet Take 1 tablet (4 mg total) by mouth every 8 (eight) hours as needed for nausea or vomiting. 03/11/20   Evans Levee C, PA-C  albuterol (PROVENTIL HFA;VENTOLIN HFA) 108 (90 Base) MCG/ACT inhaler  Inhale 2 puffs into the lungs every 4 (four) hours as needed for wheezing or shortness of breath (cough, shortness of breath or wheezing.). 04/17/18 09/03/19  Elvina Sidle, MD    Family History Family History  Problem Relation Age of Onset  . Mental illness Father   . Hypertension Maternal Aunt   . Diabetes Maternal Aunt   . Kidney disease Maternal Aunt   . Hypertension Maternal Uncle   . Kidney disease Maternal Uncle   . Alcohol abuse Maternal Uncle   . Heart disease Maternal Grandmother   . Hypertension Maternal Grandmother   . Diabetes Maternal Grandmother   . Kidney disease Maternal Grandmother   . Kidney disease Cousin   . Anesthesia problems Neg Hx     Social History Social History   Tobacco Use  . Smoking status: Never Smoker  . Smokeless tobacco: Never Used  Substance Use  Topics  . Alcohol use: No  . Drug use: No     Allergies   Latex   Review of Systems Review of Systems  Constitutional: Positive for fatigue and fever. Negative for activity change, appetite change and chills.  HENT: Positive for congestion. Negative for ear pain, rhinorrhea, sinus pressure, sore throat and trouble swallowing.   Eyes: Negative for discharge and redness.  Respiratory: Negative for cough, chest tightness and shortness of breath.   Cardiovascular: Negative for chest pain.  Gastrointestinal: Positive for nausea. Negative for abdominal pain, diarrhea and vomiting.  Genitourinary: Positive for flank pain, frequency and vaginal discharge. Negative for dysuria, genital sores, hematuria, menstrual problem, vaginal bleeding and vaginal pain.  Musculoskeletal: Positive for back pain. Negative for myalgias.  Skin: Negative for rash.  Neurological: Negative for dizziness, light-headedness and headaches.     Physical Exam Triage Vital Signs ED Triage Vitals  Enc Vitals Group     BP 03/11/20 1913 114/70     Pulse Rate 03/11/20 1913 89     Resp 03/11/20 1913 18     Temp 03/11/20 1913 (!) 100.5 F (38.1 C)     Temp Source 03/11/20 1913 Oral     SpO2 03/11/20 1913 97 %     Weight --      Height --      Head Circumference --      Peak Flow --      Pain Score 03/11/20 1910 5     Pain Loc --      Pain Edu? --      Excl. in GC? --    No data found.  Updated Vital Signs BP 114/70 (BP Location: Right Arm)   Pulse 89   Temp (!) 100.5 F (38.1 C) (Oral)   Resp 18   LMP 03/01/2020 (Approximate)   SpO2 97%   BP rechecked 188/97  Visual Acuity Right Eye Distance:   Left Eye Distance:   Bilateral Distance:    Right Eye Near:   Left Eye Near:    Bilateral Near:     Physical Exam Vitals and nursing note reviewed.  Constitutional:      General: She is not in acute distress.    Appearance: She is well-developed.  HENT:     Head: Normocephalic and atraumatic.      Ears:     Comments: Bilateral ears without tenderness to palpation of external auricle, tragus and mastoid, EAC's without erythema or swelling, TM's with good bony landmarks and cone of light. Non erythematous.     Mouth/Throat:     Comments: Oral  mucosa pink and moist, no tonsillar enlargement or exudate. Posterior pharynx patent and nonerythematous, no uvula deviation or swelling. Normal phonation. Eyes:     Extraocular Movements: Extraocular movements intact.     Conjunctiva/sclera: Conjunctivae normal.     Pupils: Pupils are equal, round, and reactive to light.  Cardiovascular:     Rate and Rhythm: Regular rhythm. Tachycardia present.     Heart sounds: No murmur.  Pulmonary:     Effort: Pulmonary effort is normal. No respiratory distress.     Breath sounds: Normal breath sounds.     Comments: Breathing comfortably at rest, CTABL, no wheezing, rales or other adventitious sounds auscultated Abdominal:     Palpations: Abdomen is soft.     Tenderness: There is no abdominal tenderness.     Comments: Soft, nondistended, nontender to palpation throughout upper abdomen and epigastrium, mild tenderness to suprapubic area, negative rebound, negative Rovsing, negative McBurney's  Genitourinary:    Comments: Normal external female genitalia, no rashes or lesions noted externally, vaginal mucosa pink, cervix pink, small amount of white discharge present in vaginal vault Mild right adnexal tenderness without palpation of masses, no cervical motion tenderness Musculoskeletal:     Cervical back: Neck supple.     Comments: Bilateral lower lumbar areas tender to palpation, weakly positive CVA tenderness bilaterally  Skin:    General: Skin is warm and dry.  Neurological:     General: No focal deficit present.     Mental Status: She is alert and oriented to person, place, and time. Mental status is at baseline.      UC Treatments / Results  Labs (all labs ordered are listed, but only abnormal  results are displayed) Labs Reviewed  POCT URINALYSIS DIP (DEVICE) - Abnormal; Notable for the following components:      Result Value   Hgb urine dipstick MODERATE (*)    Protein, ur 30 (*)    All other components within normal limits  URINE CULTURE  SARS CORONAVIRUS 2 (TAT 6-24 HRS)  POCT PREGNANCY, URINE  POC URINE PREG, ED  CERVICOVAGINAL ANCILLARY ONLY    EKG   Radiology No results found.  Procedures Procedures (including critical care time)  Medications Ordered in UC Medications - No data to display  Initial Impression / Assessment and Plan / UC Course  I have reviewed the triage vital signs and the nursing notes.  Pertinent labs & imaging results that were available during my care of the patient were reviewed by me and considered in my medical decision making (see chart for details).     UA negative for leuks and nitrites, does have moderate hemoglobin and protein.  History most consistent with UTI/pyelonephritis, but UA not suggestive of this.  Will send for urine culture.  Vaginal swab pending to screen for any vaginal infections that may be contributing to symptoms.  Covid PCR pending given associated fever today along with 1 day of mucus.  Given history opting to go ahead and treat for UTI with Keflex twice daily x1 week despite negative leuks and nitrites.  Tylenol and ibuprofen for fevers and back pain.  Rest and fluids.  Zofran for nausea.  Will monitor for results and alter therapy as needed.  Discussed strict return precautions. Patient verbalized understanding and is agreeable with plan.  Final Clinical Impressions(s) / UC Diagnoses   Final diagnoses:  Urinary tract infection with hematuria, site unspecified  Fever, unspecified     Discharge Instructions     Please begin keflex  twice daily for the next week Use anti-inflammatories for pain/swelling. You may take up to 800 mg Ibuprofen every 8 hours with food. You may supplement Ibuprofen with Tylenol  [id number] mg every 8 hours.  Rest and fluids COVID swab, Vaginal Swab and urine culture pending- based off these results we will call and alter treatment as needed  Please follow up if any symptoms not improving or worsening   ED Prescriptions    Medication Sig Dispense Auth. Provider   cephALEXin (KEFLEX) 500 MG capsule Take 1 capsule (500 mg total) by mouth 2 (two) times daily for 7 days. 14 capsule Yurem Viner C, PA-C   ibuprofen (ADVIL) 800 MG tablet Take 1 tablet (800 mg total) by mouth 3 (three) times daily. 21 tablet Torryn Fiske C, PA-C   ondansetron (ZOFRAN ODT) 4 MG disintegrating tablet Take 1 tablet (4 mg total) by mouth every 8 (eight) hours as needed for nausea or vomiting. 20 tablet Machael Raine C, PA-C   fluconazole (DIFLUCAN) 150 MG tablet Take 1 tablet (150 mg total) by mouth once for 1 dose. 2 tablet Shalamar Plourde, [place] C, PA-C     PDMP not reviewed this encounter.   Lew Dawes, [place] 03/11/20 2055

## 2020-03-12 LAB — CERVICOVAGINAL ANCILLARY ONLY
Bacterial Vaginitis (gardnerella): POSITIVE — AB
Candida Glabrata: POSITIVE — AB
Candida Vaginitis: NEGATIVE
Chlamydia: NEGATIVE
Comment: NEGATIVE
Comment: NEGATIVE
Comment: NEGATIVE
Comment: NEGATIVE
Comment: NEGATIVE
Comment: NORMAL
Neisseria Gonorrhea: NEGATIVE
Trichomonas: NEGATIVE

## 2020-03-13 LAB — SARS CORONAVIRUS 2 (TAT 6-24 HRS): SARS Coronavirus 2: POSITIVE — AB

## 2020-03-14 LAB — URINE CULTURE: Culture: >=100000 — AB

## 2020-10-21 ENCOUNTER — Encounter (HOSPITAL_COMMUNITY): Payer: Self-pay | Admitting: Emergency Medicine

## 2020-10-21 ENCOUNTER — Ambulatory Visit (HOSPITAL_COMMUNITY)
Admission: EM | Admit: 2020-10-21 | Discharge: 2020-10-21 | Disposition: A | Discharge status: Home / Self Care | Payer: Medicaid Other | Attending: Family Medicine | Admitting: Family Medicine

## 2020-10-21 ENCOUNTER — Other Ambulatory Visit: Payer: Self-pay

## 2020-10-21 DIAGNOSIS — R519 Headache, unspecified: Secondary | ICD-10-CM | POA: Diagnosis not present

## 2020-10-21 DIAGNOSIS — Z3202 Encounter for pregnancy test, result negative: Secondary | ICD-10-CM

## 2020-10-21 DIAGNOSIS — N3001 Acute cystitis with hematuria: Secondary | ICD-10-CM | POA: Insufficient documentation

## 2020-10-21 DIAGNOSIS — R300 Dysuria: Secondary | ICD-10-CM

## 2020-10-21 LAB — POC URINE PREG, ED: Preg Test, Ur: NEGATIVE

## 2020-10-21 LAB — POCT URINALYSIS DIPSTICK, ED / UC
Bilirubin Urine: NEGATIVE
Glucose, UA: NEGATIVE mg/dL
Ketones, ur: NEGATIVE mg/dL
Leukocytes,Ua: NEGATIVE
Nitrite: POSITIVE — AB
Protein, ur: NEGATIVE mg/dL
Specific Gravity, Urine: 1.02 (ref 1.005–1.030)
Urobilinogen, UA: 1 mg/dL (ref 0.0–1.0)
pH: 6.5 (ref 5.0–8.0)

## 2020-10-21 MED ORDER — NITROFURANTOIN MONOHYD MACRO 100 MG PO CAPS: 100.0000 mg | ORAL_CAPSULE | Freq: Two times a day (BID) | ORAL | 0 refills | Status: DC

## 2020-10-21 NOTE — Discharge Instructions (Addendum)
I am treating you based on your last urine culture and prescribing Macrobid 100 mg twice daily for total of 10 days.  Recommend hydrating well with water to adequately flush the bacteria from your urine.  If you develop fever, nausea or vomiting or severe abdominal pain go immediately to the emergency department.

## 2020-10-21 NOTE — ED Provider Notes (Signed)
[facility]    CSN: [id number] Arrival date & time: 10/21/20  1342      History   Chief Complaint Chief Complaint  Patient presents with  . Headache  . Dysuria    HPI Megan Ballard is a 39 y.o. female.   HPI  Patient presents for evaluation of headache and dysuria. Dysuria symptoms have been present for over 3 weeks and has progressed over the last few days. She denies chills, fever, abdominal pain. She endorses poor water intake and hydrates mostly with carbonated beverages. She had a mild headache since symptoms started. Past Medical History:  Diagnosis Date  . Abnormal Pap smear 2006  . Anemia 2002  . Anxiety   . Bacterial infection   . H/O candidiasis   . H/O chlamydia infection 2010  . H/O cystitis 2012  . H/O pyelonephritis 2011  . H/O varicella   . History of GBS (group B streptococcus) UTI, currently pregnant 2002  . Hyperemesis   . Yeast infection     Patient Active Problem List   Diagnosis Date Noted  . UTI (lower urinary tract infection) postpartum 09/06/2012  . Likely kidney stone 9/13. 09/04/2012  . SVD (spontaneous vaginal delivery) 07/09/2012  . History of macrosomia in infant in prior pregnancy, currently pregnant 07/08/2012  . History of pyelonephritis 07/08/2012  . H/O abnormal Pap smear 07/08/2012  . Sickle cell trait (HCC) 01/19/2012  . Anemia 01/19/2012  . Ptyalism 01/19/2012  . 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 INDUCED ABORTION  2011  . NO PAST SURGERIES      OB History    Gravida  5   Para  2   Term  2   Preterm  0   AB  2   Living  2     SAB  1   TAB  0   Ectopic  0   Multiple  0   Live Births  1            Home Medications    Prior to Admission medications   Medication Sig Start Date End Date Taking? Authorizing Provider  escitalopram (LEXAPRO) 10 MG tablet Take 1 tablet (10 mg total) by mouth daily. 09/03/19  Yes Wallis Bamberg, PA-C    ibuprofen (ADVIL) 800 MG tablet Take 1 tablet (800 mg total) by mouth 3 (three) times daily. 03/11/20   Wieters, Hallie C, PA-C  nitrofurantoin, macrocrystal-monohydrate, (MACROBID) 100 MG capsule Take 1 capsule (100 mg total) by mouth 2 (two) times daily. 10/21/20   Bing Neighbors, FNP  ondansetron (ZOFRAN ODT) 4 MG disintegrating tablet Take 1 tablet (4 mg total) by mouth every 8 (eight) hours as needed for nausea or vomiting. 03/11/20   Wieters, Hallie C, PA-C  albuterol (PROVENTIL HFA;VENTOLIN HFA) 108 (90 Base) MCG/ACT inhaler Inhale 2 puffs into the lungs every 4 (four) hours as needed for wheezing or shortness of breath (cough, shortness of breath or wheezing.). 04/17/18 09/03/19  Elvina Sidle, MD    Family History Family History  Problem Relation Age of Onset  . Mental illness Father   . Hypertension Maternal Aunt   . Diabetes Maternal Aunt   . Kidney disease Maternal Aunt   . Hypertension Maternal Uncle   . Kidney disease Maternal Uncle   . Alcohol abuse Maternal Uncle   . Heart disease Maternal Grandmother   . Hypertension Maternal Grandmother   . Diabetes Maternal Grandmother   .  Kidney disease Maternal Grandmother   . Kidney disease Cousin   . Anesthesia problems Neg Hx     Social History Social History   Tobacco Use  . Smoking status: Never Smoker  . Smokeless tobacco: Never Used  Substance Use Topics  . Alcohol use: No  . Drug use: No     Allergies   Latex  Review of Systems Review of Systems Pertinent negatives listed in HPI  Physical Exam Triage Vital Signs ED Triage Vitals  Enc Vitals Group     BP 10/21/20 1520 115/83     Pulse Rate 10/21/20 1520 80     Resp 10/21/20 1520 17     Temp 10/21/20 1520 98 F (36.7 C)     Temp Source 10/21/20 1520 Oral     SpO2 10/21/20 1520 99 %     Weight --      Height --      Head Circumference --      Peak Flow --      Pain Score 10/21/20 1518 0     Pain Loc --      Pain Edu? --      Excl. in GC? --     No data found.  Updated Vital Signs BP 115/83 (BP Location: Left Arm)   Pulse 80   Temp 98 F (36.7 C) (Oral)   Resp 17   LMP 09/28/2020   SpO2 99%   Visual Acuity Right Eye Distance:   Left Eye Distance:   Bilateral Distance:    Right Eye Near:   Left Eye Near:    Bilateral Near:     Physical Exam General appearance: alert, well developed, well nourished, cooperative and in no distress Head: Normocephalic, without obvious abnormality, atraumatic Respiratory: Respirations even and unlabored, normal respiratory rate Heart: rate and rhythm normal. No gallop or murmurs noted on exam  Abdomen: BS +, no distention, no rebound tenderness, or no CVA tenderness. Extremities: No gross deformities Skin: Skin color, texture, turgor normal. No rashes seen  Psych: Appropriate mood and affect.  UC Treatments / Results  Labs (all labs ordered are listed, but only abnormal results are displayed) Labs Reviewed  POCT URINALYSIS DIPSTICK, ED / UC - Abnormal; Notable for the following components:      Result Value   Hgb urine dipstick MODERATE (*)    Nitrite POSITIVE (*)    All other components within normal limits  URINE CULTURE  POC URINE PREG, ED    EKG   Radiology No results found.  Procedures Procedures (including critical care time)  Medications Ordered in UC Medications - No data to display  Initial Impression / Assessment and Plan / UC Course  I have reviewed the triage vital signs and the nursing notes.  Pertinent labs & imaging results that were available during my care of the patient were reviewed by me and considered in my medical decision making (see chart for details).    UA positive for nitrates, however symptoms negative for symptoms concerning for pyelonephritis.  Will cover with Macrobid 100 mg twice daily for total of 10 days based upon prior urine culture which grew out E. coli.  Encourage hydrating well with water and discontinuing drinking carbonated  beverages until therapy is completed. Final Clinical Impressions(s) / UC Diagnoses   Final diagnoses:  Acute cystitis with hematuria     Discharge Instructions     I am treating you based on your last urine culture and prescribing Macrobid 100 mg  twice daily for total of 10 days.  Recommend hydrating well with water to adequately flush the bacteria from your urine.  If you develop fever, nausea or vomiting or severe abdominal pain go immediately to the emergency department.   ED Prescriptions    Medication Sig Dispense Auth. Provider   nitrofurantoin, macrocrystal-monohydrate, (MACROBID) 100 MG capsule Take 1 capsule (100 mg total) by mouth 2 (two) times daily. 20 capsule Bing Neighbors, FNP     PDMP not reviewed this encounter.   Bing Neighbors, FNP 10/21/20 1859

## 2020-10-21 NOTE — ED Triage Notes (Signed)
Pt presents with abdominal pain, odor to urine, and dysuria xs 2 weeks. States have gotten worse over time.

## 2020-10-23 LAB — URINE CULTURE
Culture: >=100000 — AB
Special Requests: NORMAL

## 2020-12-06 ENCOUNTER — Other Ambulatory Visit (HOSPITAL_COMMUNITY)
Admission: RE | Admit: 2020-12-06 | Discharge: 2020-12-06 | Disposition: A | Discharge status: Home / Self Care | Payer: Medicaid Other | Source: Ambulatory Visit | Attending: Obstetrics and Gynecology | Admitting: Obstetrics and Gynecology

## 2020-12-06 ENCOUNTER — Other Ambulatory Visit: Payer: Self-pay

## 2020-12-06 ENCOUNTER — Encounter: Payer: Self-pay | Admitting: Obstetrics and Gynecology

## 2020-12-06 ENCOUNTER — Ambulatory Visit (INDEPENDENT_AMBULATORY_CARE_PROVIDER_SITE_OTHER): Payer: Medicaid Other | Admitting: Obstetrics and Gynecology

## 2020-12-06 VITALS — BP 113/73 | HR 93 | Ht 63.0 in | Wt 186.0 lb

## 2020-12-06 DIAGNOSIS — Z01419 Encounter for gynecological examination (general) (routine) without abnormal findings: Secondary | ICD-10-CM

## 2020-12-06 LAB — POCT URINE PREGNANCY: Preg Test, Ur: NEGATIVE

## 2020-12-06 MED ORDER — FLUCONAZOLE 150 MG PO TABS: 150.0000 mg | ORAL_TABLET | Freq: Once | ORAL | 3 refills | Status: AC

## 2020-12-06 MED ORDER — MEDROXYPROGESTERONE ACETATE 150 MG/ML IM SUSP: 150.0000 mg | INTRAMUSCULAR | 4 refills | Status: DC

## 2020-12-06 NOTE — Progress Notes (Signed)
Subjective:     Megan Ballard is a 40 y.o. female P2 with BMI 32 and LMP 11/21/20 who is here for a comprehensive physical exam. The patient reports no problems. She reports a monthly period lasting 3-6 days. She is sexually active without contraception and desires to restart depo-provera for birth control. She denies any pelvic pain or abnormal discharge. Patient desires STD testing today  Past Medical History:  Diagnosis Date  . Abnormal Pap smear 2006  . Anemia 2002  . Anxiety   . Bacterial infection   . H/O candidiasis   . H/O chlamydia infection 2010  . H/O cystitis 2012  . H/O pyelonephritis 2011  . H/O varicella   . History of GBS (group B streptococcus) UTI, currently pregnant 2002  . Hyperemesis   . Vaginal Pap smear, abnormal   . Yeast infection    Past Surgical History:  Procedure Laterality Date  . INDUCED ABORTION  2011  . NO PAST SURGERIES     Family History  Problem Relation Age of Onset  . Mental illness Father   . Hypertension Maternal Aunt   . Diabetes Maternal Aunt   . Kidney disease Maternal Aunt   . Hypertension Maternal Uncle   . Kidney disease Maternal Uncle   . Alcohol abuse Maternal Uncle   . Heart disease Maternal Grandmother   . Hypertension Maternal Grandmother   . Diabetes Maternal Grandmother   . Kidney disease Maternal Grandmother   . Kidney disease Cousin   . Anesthesia problems Neg Hx     Social History   Socioeconomic History  . Marital status: Single    Spouse name: Not on file  . Number of children: 2  . Years of education: Not on file  . Highest education level: Not on file  Occupational History  . Not on file  Tobacco Use  . Smoking status: Never Smoker  . Smokeless tobacco: Never Used  Vaping Use  . Vaping Use: Never used  Substance and Sexual Activity  . Alcohol use: No  . Drug use: No  . Sexual activity: Yes    Birth control/protection: None  Other Topics Concern  . Not on file  Social History Narrative   . Not on file   Social Determinants of Health   Financial Resource Strain: Not on file  Food Insecurity: Not on file  Transportation Needs: Not on file  Physical Activity: Not on file  Stress: Not on file  Social Connections: Not on file  Intimate Partner Violence: Not on file   Health Maintenance  Topic Date Due  . Hepatitis C Screening  Never done  . COVID-19 Vaccine (1) Never done  . PAP SMEAR-Modifier  Never done  . INFLUENZA VACCINE  Never done  . TETANUS/TDAP  07/10/2022  . HIV Screening  Completed       Review of Systems Pertinent items noted in HPI and remainder of comprehensive ROS otherwise negative.   Objective:  Blood pressure 113/73, pulse 93, height 5\' 3"  (1.6 m), weight 186 lb (84.4 kg), last menstrual period 11/21/2020, unknown if currently breastfeeding.     GENERAL: Well-developed, well-nourished female in no acute distress.  HEENT: Normocephalic, atraumatic. Sclerae anicteric.  NECK: Supple. Normal thyroid.  LUNGS: Clear to auscultation bilaterally.  HEART: Regular rate and rhythm. BREASTS: Symmetric in size. No palpable masses or lymphadenopathy, skin changes, or nipple drainage. ABDOMEN: Soft, nontender, nondistended. No organomegaly. PELVIC: Normal external female genitalia. Vagina is pink and rugated.  Normal discharge. Normal  appearing cervix. Uterus is normal in size.  No adnexal mass or tenderness. EXTREMITIES: No cyanosis, clubbing, or edema, 2+ distal pulses.    Assessment:    Healthy female exam.      Plan:    Pap smear collected STD screening collected Patient is not fasting- some health maintenance labs ordered Patient will be contacted with abnormal results Patient with recent unprotected intercourse in ovulation window. Will have patient return in 2 weeks to initiate depo-provera  See After Visit Summary for Counseling Recommendations

## 2020-12-06 NOTE — Progress Notes (Signed)
New GYN presents for AEX/PAP/STD Screening.  She wants to start DEPO, last unprotected sex was 3 days ago.  LMP 11/21/2020.

## 2020-12-07 LAB — HEMOGLOBIN A1C
Est. average glucose Bld gHb Est-mCnc: 114 mg/dL
Hgb A1c MFr Bld: 5.6 % (ref 4.8–5.6)

## 2020-12-07 LAB — CERVICOVAGINAL ANCILLARY ONLY
Bacterial Vaginitis (gardnerella): POSITIVE — AB
Candida Glabrata: NEGATIVE
Candida Vaginitis: NEGATIVE
Chlamydia: NEGATIVE
Comment: NEGATIVE
Comment: NEGATIVE
Comment: NEGATIVE
Comment: NEGATIVE
Comment: NEGATIVE
Comment: NORMAL
Neisseria Gonorrhea: NEGATIVE
Trichomonas: NEGATIVE

## 2020-12-07 LAB — CBC
Hematocrit: 37.1 % (ref 34.0–46.6)
Hemoglobin: 12.5 g/dL (ref 11.1–15.9)
MCH: 29.2 pg (ref 26.6–33.0)
MCHC: 33.7 g/dL (ref 31.5–35.7)
MCV: 87 fL (ref 79–97)
Platelets: 264 10*3/uL (ref 150–450)
RBC: 4.28 x10E6/uL (ref 3.77–5.28)
RDW: 13.4 % (ref 11.7–15.4)
WBC: 6.4 10*3/uL (ref 3.4–10.8)

## 2020-12-07 LAB — RPR+HBSAG+HCVAB+...
HIV Screen 4th Generation wRfx: NONREACTIVE
Hep C Virus Ab: <0.1 s/co ratio (ref 0.0–0.9)
Hepatitis B Surface Ag: NEGATIVE
RPR Ser Ql: NONREACTIVE

## 2020-12-07 LAB — TSH: TSH: 0.543 u[IU]/mL (ref 0.450–4.500)

## 2020-12-07 MED ORDER — METRONIDAZOLE 500 MG PO TABS: 500.0000 mg | ORAL_TABLET | Freq: Two times a day (BID) | ORAL | 0 refills | Status: DC

## 2020-12-07 NOTE — Addendum Note (Signed)
Addended by: Catalina Antigua on: 12/07/2020 07:59 PM   Modules accepted: Orders

## 2020-12-09 LAB — URINE CULTURE

## 2020-12-09 MED ORDER — SULFAMETHOXAZOLE-TRIMETHOPRIM 800-160 MG PO TABS: 1.0000 | ORAL_TABLET | Freq: Two times a day (BID) | ORAL | 1 refills | Status: DC

## 2020-12-09 NOTE — Addendum Note (Signed)
Addended by: Catalina Antigua on: 12/09/2020 09:29 AM   Modules accepted: Orders

## 2020-12-12 LAB — CYTOLOGY - PAP
Adequacy: ABSENT
Comment: NEGATIVE
Diagnosis: NEGATIVE
High risk HPV: NEGATIVE

## 2020-12-20 ENCOUNTER — Ambulatory Visit: Payer: Medicaid Other

## 2020-12-22 ENCOUNTER — Ambulatory Visit: Payer: Medicaid Other

## 2020-12-23 ENCOUNTER — Other Ambulatory Visit: Payer: Self-pay

## 2020-12-23 MED ORDER — FLUCONAZOLE 150 MG PO TABS: 150.0000 mg | ORAL_TABLET | Freq: Once | ORAL | 1 refills | Status: AC

## 2020-12-23 NOTE — Telephone Encounter (Signed)
Patient is requesting a refill diflucan.

## 2020-12-29 ENCOUNTER — Ambulatory Visit (INDEPENDENT_AMBULATORY_CARE_PROVIDER_SITE_OTHER): Payer: Medicaid Other

## 2020-12-29 ENCOUNTER — Other Ambulatory Visit: Payer: Self-pay

## 2020-12-29 DIAGNOSIS — Z3042 Encounter for surveillance of injectable contraceptive: Secondary | ICD-10-CM

## 2020-12-29 LAB — POCT URINE PREGNANCY: Preg Test, Ur: NEGATIVE

## 2020-12-29 MED ORDER — MEDROXYPROGESTERONE ACETATE 150 MG/ML IM SUSP
150.0000 mg | INTRAMUSCULAR | Status: AC
  Administered 2020-12-29: 150 mg via INTRAMUSCULAR

## 2020-12-29 NOTE — Progress Notes (Signed)
Subjective: Pt in for 2nd UPT for Depo Provera injection.  Denies IC since last visit  Objective: Need for contraception. No unusual complaints.    Assessment: UPT neg. Depo given R upper outer quadrant. Pt tolerated Depo injection.   Plan:  Next injection due Apr 13-27  Administrations This Visit    medroxyPROGESTERone (DEPO-PROVERA) injection 150 mg    Admin Date 12/29/2020 Action Given Dose 150 mg Route Intramuscular Administered By Lewayne Bunting, CMA

## 2020-12-29 NOTE — Progress Notes (Signed)
Agree with A & P. 

## 2021-03-22 ENCOUNTER — Ambulatory Visit: Payer: Medicaid Other

## 2021-04-05 ENCOUNTER — Ambulatory Visit: Payer: Medicaid Other

## 2021-04-07 ENCOUNTER — Ambulatory Visit: Payer: Medicaid Other

## 2021-04-11 ENCOUNTER — Ambulatory Visit: Payer: Medicaid Other

## 2021-04-19 ENCOUNTER — Ambulatory Visit: Payer: Medicaid Other

## 2021-06-14 ENCOUNTER — Ambulatory Visit
Admission: EM | Admit: 2021-06-14 | Discharge: 2021-06-14 | Disposition: A | Discharge status: Home / Self Care | Payer: Medicaid Other | Attending: Student | Admitting: Student

## 2021-06-14 ENCOUNTER — Other Ambulatory Visit: Payer: Self-pay

## 2021-06-14 DIAGNOSIS — Z1152 Encounter for screening for COVID-19: Secondary | ICD-10-CM | POA: Insufficient documentation

## 2021-06-14 DIAGNOSIS — N3001 Acute cystitis with hematuria: Secondary | ICD-10-CM | POA: Diagnosis present

## 2021-06-14 DIAGNOSIS — Z8616 Personal history of COVID-19: Secondary | ICD-10-CM | POA: Insufficient documentation

## 2021-06-14 DIAGNOSIS — Z3202 Encounter for pregnancy test, result negative: Secondary | ICD-10-CM | POA: Diagnosis not present

## 2021-06-14 DIAGNOSIS — R0981 Nasal congestion: Secondary | ICD-10-CM | POA: Insufficient documentation

## 2021-06-14 LAB — POCT URINALYSIS DIP (MANUAL ENTRY)
Bilirubin, UA: NEGATIVE
Glucose, UA: NEGATIVE mg/dL
Ketones, POC UA: NEGATIVE mg/dL
Nitrite, UA: NEGATIVE
Spec Grav, UA: >=1.03 — AB (ref 1.010–1.025)
Urobilinogen, UA: 1 E.U./dL
pH, UA: 6 (ref 5.0–8.0)

## 2021-06-14 LAB — POCT URINE PREGNANCY: Preg Test, Ur: NEGATIVE

## 2021-06-14 MED ORDER — CEPHALEXIN 500 MG PO CAPS: 500.0000 mg | ORAL_CAPSULE | Freq: Four times a day (QID) | ORAL | 0 refills | Status: DC

## 2021-06-14 NOTE — ED Provider Notes (Signed)
[name]    CSN: [id number] Arrival date & time: 06/14/21  1823      History   Chief Complaint Chief Complaint  Patient presents with   Headache    HPI Megan Ballard is a 40 y.o. female presenting with headaches, nausea, fatigue, suprapubic pressure, urinary odor. Medical history UTI and covid. States she sometimes gets these symptoms with her UTIs. Throbbing pressure behind forehead without vision changes, dizziness, weakness, photophobia. Nausea without vomiting or diarrhea. Denies vaginal symptoms including discharge, irritation, itching. Also with nasal congestion x2 weeks, without facial pressure ear pain dizziness shortness of breath cough.  HPI  Past Medical History:  Diagnosis Date   Abnormal Pap smear 2006   Anemia 2002   Anxiety    Bacterial infection    H/O candidiasis    H/O chlamydia infection 2010   H/O cystitis 2012   H/O pyelonephritis 2011   H/O varicella    History of GBS (group B streptococcus) UTI, currently pregnant 2002   Hyperemesis    Vaginal Pap smear, abnormal    Yeast infection     Patient Active Problem List   Diagnosis Date Noted   UTI (lower urinary tract infection) postpartum 09/06/2012   Likely kidney stone 9/13. 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 Authorizing Provider  cephALEXin (KEFLEX) 500 MG capsule Take 1 capsule (500 mg total) by mouth 4 (four) times daily. 06/14/21  Yes Rhys Martini, PA-C  escitalopram (LEXAPRO) 10 MG tablet Take 1 tablet (10 mg total) by mouth daily. 09/03/19   Wallis Bamberg, PA-C  ibuprofen (ADVIL) 800 MG tablet Take 1 tablet (800 mg total) by mouth 3 (three) times daily. 03/11/20   Wieters, Hallie C, PA-C  medroxyPROGESTERone (DEPO-PROVERA) 150 MG/ML injection Inject 1 mL (150 mg total) into the muscle every 3 (three) months. 12/06/20   Constant, Peggy, MD  albuterol (PROVENTIL HFA;VENTOLIN HFA) 108 (90 Base) MCG/ACT inhaler Inhale 2 puffs into the lungs every 4 (four) hours as needed for wheezing or shortness of breath (cough, shortness of breath or wheezing.). 04/17/18 09/03/19  Elvina Sidle, MD    Family History Family History  Problem Relation Age of Onset   Mental illness Father    Hypertension Maternal Aunt    Diabetes Maternal Aunt    Kidney disease Maternal Aunt    Hypertension Maternal Uncle    Kidney disease Maternal Uncle    Alcohol abuse Maternal Uncle    Heart disease Maternal Grandmother    Hypertension Maternal Grandmother  Diabetes Maternal Grandmother    Kidney disease Maternal Grandmother    Kidney disease Cousin    Anesthesia problems Neg Hx     Social History Social History   Tobacco Use   Smoking status: Never   Smokeless tobacco: Never  Vaping Use   Vaping Use: Never used  Substance Use Topics   Alcohol use: No   Drug use: No     Allergies   Latex   Review of Systems Review of Systems  Constitutional:  Negative for appetite change, chills and fever.  HENT:  Negative for congestion, ear pain, rhinorrhea, sinus pressure, sinus pain and sore throat.   Eyes:  Negative for pain, redness and visual disturbance.  Respiratory:  Negative for cough, chest tightness, shortness of breath and wheezing.   Cardiovascular:  Negative for chest pain and palpitations.   Gastrointestinal:  Positive for abdominal pain and nausea. Negative for constipation, diarrhea and vomiting.  Genitourinary:  Negative for decreased urine volume, difficulty urinating, dysuria, flank pain, frequency, genital sores, hematuria, menstrual problem, pelvic pain, urgency, vaginal bleeding, vaginal discharge and vaginal pain.       Urinary odor  Musculoskeletal:  Negative for back pain and myalgias.  Skin:  Negative for rash.  Neurological:  Negative for dizziness, weakness and headaches.  Psychiatric/Behavioral:  Negative for confusion.   All other systems reviewed and are negative.   Physical Exam Triage Vital Signs ED Triage Vitals  Enc Vitals Group     BP 06/14/21 1950 133/85     Pulse Rate 06/14/21 1950 75     Resp 06/14/21 1950 18     Temp 06/14/21 1950 98.2 F (36.8 C)     Temp Source 06/14/21 1950 Oral     SpO2 06/14/21 1950 98 %     Weight --      Height --      Head Circumference --      Peak Flow --      Pain Score 06/14/21 1951 5     Pain Loc --      Pain Edu? --      Excl. in GC? --    No data found.  Updated Vital Signs BP 133/85 (BP Location: Left Arm)   Pulse 75   Temp 98.2 F (36.8 C) (Oral)   Resp 18   LMP  (LMP Unknown)   SpO2 98%   Breastfeeding No   Visual Acuity Right Eye Distance:   Left Eye Distance:   Bilateral Distance:    Right Eye Near:   Left Eye Near:    Bilateral Near:     Physical Exam Vitals reviewed.  Constitutional:      General: She is not in acute distress.    Appearance: Normal appearance. She is not ill-appearing.  HENT:     Head: Normocephalic and atraumatic.     Right Ear: Hearing, tympanic membrane, ear canal and external ear normal. No swelling or tenderness. There is no impacted cerumen. No mastoid tenderness. Tympanic membrane is not perforated, erythematous, retracted or bulging.     Left Ear: Hearing, tympanic membrane, ear canal and external ear normal. No swelling or tenderness. There is no  impacted cerumen. No mastoid tenderness. Tympanic membrane is not perforated, erythematous, retracted or bulging.     Nose: Congestion present.     Right Sinus: No maxillary sinus tenderness or frontal sinus tenderness.     Left Sinus: No maxillary sinus tenderness or frontal sinus tenderness.     Mouth/Throat:  Mouth: Mucous membranes are moist.     Pharynx: Uvula midline. No oropharyngeal exudate or posterior oropharyngeal erythema.     Tonsils: No tonsillar exudate.     Comments: Moist mucous membranes Eyes:     Extraocular Movements: Extraocular movements intact.     Pupils: Pupils are equal, round, and reactive to light.  Cardiovascular:     Rate and Rhythm: Normal rate and regular rhythm.     Heart sounds: Normal heart sounds.  Pulmonary:     Effort: Pulmonary effort is normal.     Breath sounds: Normal breath sounds and air entry. No wheezing, rhonchi or rales.  Chest:     Chest wall: No tenderness.  Abdominal:     General: Abdomen is flat. Bowel sounds are normal. There is no distension.     Palpations: Abdomen is soft. There is no mass.     Tenderness: There is abdominal tenderness in the suprapubic area. There is no right CVA tenderness, left CVA tenderness, guarding or rebound. Negative signs include Murphy's sign, Rovsing's sign and McBurney's sign.  Lymphadenopathy:     Cervical: No cervical adenopathy.  Skin:    General: Skin is warm.     Capillary Refill: Capillary refill takes less than 2 seconds.     Comments: Good skin turgor  Neurological:     General: No focal deficit present.     Mental Status: She is alert and oriented to person, place, and time.  Psychiatric:        Attention and Perception: Attention and perception normal.        Mood and Affect: Mood and affect normal.        Behavior: Behavior normal. Behavior is cooperative.        Thought Content: Thought content normal.        Judgment: Judgment normal.     UC Treatments / Results  Labs (all  labs ordered are listed, but only abnormal results are displayed) Labs Reviewed  POCT URINALYSIS DIP (MANUAL ENTRY) - Abnormal; Notable for the following components:      Result Value   Clarity, UA cloudy (*)    Spec Grav, UA >=1.030 (*)    Blood, UA moderate (*)    Protein Ur, POC trace (*)    Leukocytes, UA Trace (*)    All other components within normal limits  URINE CULTURE  NOVEL CORONAVIRUS, NAA  POCT URINE PREGNANCY  CERVICOVAGINAL ANCILLARY ONLY    EKG   Radiology No results found.  Procedures Procedures (including critical care time)  Medications Ordered in UC Medications - No data to display  Initial Impression / Assessment and Plan / UC Course  I have reviewed the triage vital signs and the nursing notes.  Pertinent labs & imaging results that were available during my care of the patient were reviewed by me and considered in my medical decision making (see chart for details).     This patient is a very pleasant 39 y.o. year old female presenting with acute cystitis and nasal congestion, requesting covid PCR. Afebrile, nontachy, suprapubic pressure but no CVAT.   Urine pregnancy negative UA with moderate blood, trace leuk. Culture sent. Self swab sent for BV and yeast, denies STI risk  Keflex as below, good hydration.  Nasal congestion- covid PCR sent. History covid in the past.  ED return precautions discussed. Patient verbalizes understanding and agreement.    Final Clinical Impressions(s) / UC Diagnoses   Final diagnoses:  Acute cystitis with hematuria  Negative pregnancy test  Encounter for screening for COVID-19  History of COVID-19  Nasal congestion     Discharge Instructions      -Keflex 4 times daily for 5 days. -Drink plenty of fluids -We're screening for BV and yeast, we'll call in about 3 days if this is positive     ED Prescriptions     Medication Sig Dispense Auth. Provider   cephALEXin (KEFLEX) 500 MG capsule Take 1  capsule (500 mg total) by mouth 4 (four) times daily. 20 capsule Rhys Martini, PA-C      PDMP not reviewed this encounter.   Rhys Martini, PA-C 06/14/21 2031

## 2021-06-14 NOTE — ED Triage Notes (Signed)
Pt c/o headache, nausea, and extreme fatigue since Friday.

## 2021-06-14 NOTE — Discharge Instructions (Addendum)
-  Keflex 4 times daily for 5 days. -Drink plenty of fluids -We're screening for BV and yeast, we'll call in about 3 days if this is positive

## 2021-06-16 ENCOUNTER — Telehealth (HOSPITAL_COMMUNITY): Payer: Self-pay | Admitting: Emergency Medicine

## 2021-06-16 LAB — CERVICOVAGINAL ANCILLARY ONLY
Bacterial Vaginitis (gardnerella): POSITIVE — AB
Candida Glabrata: NEGATIVE
Candida Vaginitis: POSITIVE — AB
Comment: NEGATIVE
Comment: NEGATIVE
Comment: NEGATIVE

## 2021-06-16 LAB — NOVEL CORONAVIRUS, NAA: SARS-CoV-2, NAA: NOT DETECTED

## 2021-06-16 LAB — SARS-COV-2, NAA 2 DAY TAT

## 2021-06-16 MED ORDER — FLUCONAZOLE 150 MG PO TABS: 150.0000 mg | ORAL_TABLET | Freq: Once | ORAL | 0 refills | Status: AC

## 2021-06-16 MED ORDER — METRONIDAZOLE 500 MG PO TABS: 500.0000 mg | ORAL_TABLET | Freq: Two times a day (BID) | ORAL | 0 refills | Status: DC

## 2021-06-17 LAB — URINE CULTURE: Culture: >=100000 — AB

## 2021-10-10 ENCOUNTER — Ambulatory Visit: Payer: Medicaid Other | Admitting: Obstetrics and Gynecology

## 2021-10-11 ENCOUNTER — Ambulatory Visit: Payer: Medicaid Other | Admitting: Obstetrics and Gynecology

## 2021-10-11 ENCOUNTER — Other Ambulatory Visit: Payer: Self-pay

## 2021-10-11 ENCOUNTER — Encounter: Payer: Self-pay | Admitting: Obstetrics and Gynecology

## 2021-10-11 VITALS — BP 129/79 | HR 84 | Wt 193.0 lb

## 2021-10-11 DIAGNOSIS — Z1231 Encounter for screening mammogram for malignant neoplasm of breast: Secondary | ICD-10-CM | POA: Diagnosis not present

## 2021-10-11 DIAGNOSIS — Z3042 Encounter for surveillance of injectable contraceptive: Secondary | ICD-10-CM

## 2021-10-11 LAB — POCT URINE PREGNANCY: Preg Test, Ur: NEGATIVE

## 2021-10-11 MED ORDER — MEDROXYPROGESTERONE ACETATE 150 MG/ML IM SUSP: 150.0000 mg | INTRAMUSCULAR | 4 refills | Status: DC

## 2021-10-11 MED ORDER — MEDROXYPROGESTERONE ACETATE 150 MG/ML IM SUSP: 150.0000 mg | INTRAMUSCULAR | 5 refills | Status: DC

## 2021-10-11 NOTE — Progress Notes (Signed)
40 yo P2 presenting for continuation of depo-provera. Patient reports doing well and is without complaints. She reports amenorrhea with depo-provera and wishes to continue. She denies pelvic pain or abnormal discharge. She denies urinary symptoms. She does not want STI screening  Past Medical History:  Diagnosis Date   Abnormal Pap smear 2006   Anemia 2002   Anxiety    Bacterial infection    H/O candidiasis    H/O chlamydia infection 2010   H/O cystitis 2012   H/O pyelonephritis 2011   H/O varicella    History of GBS (group B streptococcus) UTI, currently pregnant 2002   Hyperemesis    Vaginal Pap smear, abnormal    Yeast infection    Past Surgical History:  Procedure Laterality Date   INDUCED ABORTION  2011   NO PAST SURGERIES     Family History  Problem Relation Age of Onset   Mental illness Father    Hypertension Maternal Aunt    Diabetes Maternal Aunt    Kidney disease Maternal Aunt    Hypertension Maternal Uncle    Kidney disease Maternal Uncle    Alcohol abuse Maternal Uncle    Heart disease Maternal Grandmother    Hypertension Maternal Grandmother    Diabetes Maternal Grandmother    Kidney disease Maternal Grandmother    Kidney disease Cousin    Anesthesia problems Neg Hx    Social History   Tobacco Use   Smoking status: Never   Smokeless tobacco: Never  Vaping Use   Vaping Use: Never used  Substance Use Topics   Alcohol use: No   Drug use: No   Blood pressure 129/79, pulse 84, weight 193 lb (87.5 kg). GENERAL: Well-developed, well-nourished female in no acute distress.  NEURO: alert and oriented x 3  A/P 40 yo here for depo-provera surveillance - Continue with depo-provera - Screening mammogram ordered - Patient with normal pap smear 12/2020 - RTC prn

## 2021-10-18 ENCOUNTER — Ambulatory Visit (HOSPITAL_BASED_OUTPATIENT_CLINIC_OR_DEPARTMENT_OTHER): Admission: RE | Admit: 2021-10-18 | Payer: Medicaid Other | Source: Ambulatory Visit | Admitting: Radiology

## 2021-10-30 ENCOUNTER — Telehealth: Payer: Medicaid Other | Admitting: Emergency Medicine

## 2021-10-30 DIAGNOSIS — R109 Unspecified abdominal pain: Secondary | ICD-10-CM

## 2021-10-30 DIAGNOSIS — R112 Nausea with vomiting, unspecified: Secondary | ICD-10-CM

## 2021-10-30 MED ORDER — CIPROFLOXACIN HCL 500 MG PO TABS: 500.0000 mg | ORAL_TABLET | Freq: Two times a day (BID) | ORAL | 0 refills | Status: AC

## 2021-10-30 MED ORDER — ONDANSETRON HCL 4 MG PO TABS: 4.0000 mg | ORAL_TABLET | Freq: Three times a day (TID) | ORAL | 0 refills | Status: DC | PRN

## 2021-10-30 NOTE — Progress Notes (Signed)
Virtual Visit Consent   Megan Ballard, you are scheduled for a virtual visit with a [facility] provider today.     Just as with appointments in the office, your consent must be obtained to participate.  Your consent will be active for this visit and any virtual visit you may have with one of our providers in the next 365 days.     If you have a MyChart account, a copy of this consent can be sent to you electronically.  All virtual visits are billed to your insurance company just like a traditional visit in the office.    As this is a virtual visit, video technology does not allow for your provider to perform a traditional examination.  This may limit your provider's ability to fully assess your condition.  If your provider identifies any concerns that need to be evaluated in person or the need to arrange testing (such as labs, EKG, etc.), we will make arrangements to do so.     Although advances in technology are sophisticated, we cannot ensure that it will always work on either your end or our end.  If the connection with a video visit is poor, the visit may have to be switched to a telephone visit.  With either a video or telephone visit, we are not always able to ensure that we have a secure connection.     I need to obtain your verbal consent now.   Are you willing to proceed with your visit today? Yes   Megan Ballard has provided verbal consent on 10/30/2021 for a virtual visit (video or telephone).   [name], [place]   Date: 10/30/2021 10:15 AM   Virtual Visit via Video Note   I, [name], connected with  Megan Ballard  ([id number], August 17, 1981) on 10/30/21 at 10:00 AM EST by a video-enabled telemedicine application and verified that I am speaking with the correct person using two identifiers.  Location: Patient: Virtual Visit Location Patient: Other: store Provider: Virtual Visit Location Provider: Home Office   I discussed the limitations of  evaluation and management by telemedicine and the availability of in person appointments. The patient expressed understanding and agreed to proceed.    History of Present Illness: Megan Ballard is a 40 y.o. who identifies as a female who was assigned female at birth, and is being seen today for fever, tmax of 101, chills, nausea, vomiting x 3 episdes, and flank pain x 1 day.  Patient denies a precipitating event, recent sexual encounter, excessive caffeine intake.  Denies flu or covid exposure.  States she was tested for covid/ flu in the last 24 hours and it was negative.  Localizes the pain to the RT sided flank pain.  Pain is intermittent and describes it as achy.  Has tried OTC medications without relief.  Denies aggravating factors.  Admits to similar symptoms in the past with kidney infection.  Denies abdominal pain, abnormal vaginal discharge or bleeding, hematuria.    LMP: No LMP recorded.  ROS: As in HPI.  All other pertinent ROS negative.     HPI: HPI  Problems:  Patient Active Problem List   Diagnosis Date Noted   UTI (lower urinary tract infection) postpartum 09/06/2012   Likely kidney stone 9/13. 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 (Patient not taking: Reported on 10/11/2021), Disp: 90 tablet, Rfl: 0   ibuprofen (ADVIL) 800 MG tablet, Take 1 tablet (800 mg total) by mouth 3 (three) times daily., Disp: 21 tablet, Rfl: 0   medroxyPROGESTERone (DEPO-PROVERA) 150 MG/ML injection, Inject 1 mL (150 mg total) into the muscle every 3 (three) months., Disp: 1 mL, Rfl: 5   medroxyPROGESTERone (DEPO-PROVERA) 150 MG/ML injection, Inject 1 mL (150 mg total) into the muscle every 3 (three) months., Disp: 1 mL, Rfl: 4  Current Facility-Administered Medications:    medroxyPROGESTERone (DEPO-PROVERA) injection 150 mg, 150 mg, Intramuscular, Q90 days, Hermina Staggers, MD, 150 mg at 12/29/20 0900  Observations/Objective: Patient is well-developed, well-nourished in no acute distress.  Waking comfortably around store Head is normocephalic, atraumatic.  No labored breathing.  Speech is clear and coherent with logical content.  Patient is alert and oriented at baseline.    Assessment and Plan: 1. Flank pain  2. Nausea and vomiting, unspecified vomiting type Symptoms consistent with kidney infection  Push fluids and get plenty of rest.   Ciprofloxacin prescribed since failed improvement with trial of keflex x 4 doses; DO NOT TAKE THIS MEDICATION IF PREGNANT OR PLANNING ON BECOMING PREGNANT Zofran for nausea and vomiting Follow up in person at urgent care or go to ER if you have any new or worsening symptoms such as fever, abdominal pain, persistent nausea/vomiting, worsening flank pain, vaginal complaints etc...  Follow Up Instructions: I discussed the assessment and treatment plan with the patient. The patient was provided an opportunity to ask questions and all were answered. The patient agreed with the plan and demonstrated an understanding of the instructions.  A copy of instructions were sent to the patient via MyChart unless otherwise noted below.    The patient was advised to call back or seek an in-person  evaluation if the symptoms worsen or if the condition fails to improve as anticipated.  Time:  I spent 10 minutes with the patient via telehealth technology discussing the above problems/concerns.    Rennis Harding, PA-C

## 2021-10-30 NOTE — Patient Instructions (Signed)
Megan Ballard, thank you for joining [place], PA-C for today's virtual visit.  While this provider is not your primary care provider (PCP), if your PCP is located in our provider database this encounter information will be shared with them immediately following your visit.  Consent: (Patient) Megan Ballard provided verbal consent for this virtual visit at the beginning of the encounter.  Current Medications:  Current Outpatient Medications:    ciprofloxacin (CIPRO) 500 MG tablet, Take 1 tablet (500 mg total) by mouth 2 (two) times daily for 7 days., Disp: 14 tablet, Rfl: 0   ondansetron (ZOFRAN) 4 MG tablet, Take 1 tablet (4 mg total) by mouth every 8 (eight) hours as needed for nausea or vomiting., Disp: 20 tablet, Rfl: 0   escitalopram (LEXAPRO) 10 MG tablet, Take 1 tablet (10 mg total) by mouth daily. (Patient not taking: Reported on 10/11/2021), Disp: 90 tablet, Rfl: 0   ibuprofen (ADVIL) 800 MG tablet, Take 1 tablet (800 mg total) by mouth 3 (three) times daily., Disp: 21 tablet, Rfl: 0   medroxyPROGESTERone (DEPO-PROVERA) 150 MG/ML injection, Inject 1 mL (150 mg total) into the muscle every 3 (three) months., Disp: 1 mL, Rfl: 5   medroxyPROGESTERone (DEPO-PROVERA) 150 MG/ML injection, Inject 1 mL (150 mg total) into the muscle every 3 (three) months., Disp: 1 mL, Rfl: 4  Current Facility-Administered Medications:    medroxyPROGESTERone (DEPO-PROVERA) injection 150 mg, 150 mg, Intramuscular, Q90 days, Hermina Staggers, MD, 150 mg at 12/29/20 0900   Medications ordered in this encounter:  Meds ordered this encounter  Medications   ciprofloxacin (CIPRO) 500 MG tablet    Sig: Take 1 tablet (500 mg total) by mouth 2 (two) times daily for 7 days.    Dispense:  14 tablet    Refill:  0    Order Specific Question:   Supervising Provider    Answer:   MILLER, BRIAN [[id number]]   ondansetron (ZOFRAN) 4 MG tablet    Sig: Take 1 tablet (4 mg total) by mouth every 8 (eight) hours  as needed for nausea or vomiting.    Dispense:  20 tablet    Refill:  0    Order Specific Question:   Supervising Provider    Answer:   Hyacinth Meeker, BRIAN [[id number]]     *If you need refills on other medications prior to your next appointment, please contact your pharmacy*  Follow-Up: Call back or seek an in-person evaluation if the symptoms worsen or if the condition fails to improve as anticipated.  Other Instructions Symptoms consistent with kidney infection  Push fluids and get plenty of rest.   Ciprofloxacin prescribed since failed improvement with trial of keflex x 4 doses; DO NOT TAKE THIS MEDICATION IF PREGNANT OR PLANNING ON BECOMING PREGNANT Zofran for nausea and vomiting Follow up in person at urgent care or go to ER if you have any new or worsening symptoms such as fever, abdominal pain, persistent nausea/vomiting, worsening flank pain, vaginal complaints etc...   If you have been instructed to have an in-person evaluation today at a local Urgent Care facility, please use the link below. It will take you to a list of all of our available [place] [facility], including address, phone number and hours of operation. Please do not delay care.  [facility]  If you or a family member do not have a primary care provider, use the link below to schedule a visit and establish care. When you choose a [facility] primary care physician or advanced practice provider, you gain a long-term partner in health. Find a Primary Care Provider  Learn more about [facility]'s in-office and virtual care options: [facility]

## 2021-10-31 ENCOUNTER — Telehealth: Payer: Medicaid Other | Admitting: Emergency Medicine

## 2021-10-31 DIAGNOSIS — Z0289 Encounter for other administrative examinations: Secondary | ICD-10-CM

## 2021-10-31 MED ORDER — FLUCONAZOLE 150 MG PO TABS: 150.0000 mg | ORAL_TABLET | Freq: Once | ORAL | 0 refills | Status: AC

## 2021-10-31 NOTE — Progress Notes (Signed)
Virtual Visit Consent   Megan Ballard, you are scheduled for a virtual visit with a [facility] provider today.     Just as with appointments in the office, your consent must be obtained to participate.  Your consent will be active for this visit and any virtual visit you may have with one of our providers in the next 365 days.     If you have a MyChart account, a copy of this consent can be sent to you electronically.  All virtual visits are billed to your insurance company just like a traditional visit in the office.    As this is a virtual visit, video technology does not allow for your provider to perform a traditional examination.  This may limit your provider's ability to fully assess your condition.  If your provider identifies any concerns that need to be evaluated in person or the need to arrange testing (such as labs, EKG, etc.), we will make arrangements to do so.     Although advances in technology are sophisticated, we cannot ensure that it will always work on either your end or our end.  If the connection with a video visit is poor, the visit may have to be switched to a telephone visit.  With either a video or telephone visit, we are not always able to ensure that we have a secure connection.     I need to obtain your verbal consent now.   Are you willing to proceed with your visit today?    Megan Ballard has provided verbal consent on 10/31/2021 for a virtual visit (video or telephone).   Roxy Horseman, PA-C   Date: 10/31/2021 10:19 AM   Virtual Visit via Video Note   I, Roxy Horseman, connected with  Megan Ballard  ([id number], 16-Jun-1981) on 10/31/21 at 10:15 AM EST by a video-enabled telemedicine application and verified that I am speaking with the correct person using two identifiers.  Location: Patient: Virtual Visit Location Patient: Mobile Provider: Virtual Visit Location Provider: Office/Clinic   I discussed the limitations of evaluation  and management by telemedicine and the availability of in person appointments. The patient expressed understanding and agreed to proceed.    History of Present Illness: Megan Ballard is a 40 y.o. who identifies as a female who was assigned female at birth, and is being seen today for needing a work note.  States that she has a f/u appointment with her doctor, but just needs a note from her previous visit.  She also asks if she can get some diflucan from her previous visit.Marland Kitchen  HPI: HPI  Problems:  Patient Active Problem List   Diagnosis Date Noted   UTI (lower urinary tract infection) postpartum 09/06/2012   Likely kidney stone 9/13. 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 (Patient not taking: Reported on 10/11/2021),  Disp: 90 tablet, Rfl: 0   ibuprofen (ADVIL) 800 MG tablet, Take 1 tablet (800 mg total) by mouth 3 (three) times daily., Disp: 21 tablet, Rfl: 0   medroxyPROGESTERone (DEPO-PROVERA) 150 MG/ML injection, Inject 1 mL (150 mg total) into the muscle every 3 (three) months., Disp: 1 mL, Rfl: 5   medroxyPROGESTERone (DEPO-PROVERA) 150 MG/ML injection, Inject 1 mL (150 mg total) into the muscle every 3 (three) months., Disp: 1 mL, Rfl: 4   ondansetron (ZOFRAN) 4 MG tablet, Take 1 tablet (4 mg total) by mouth every 8 (eight) hours as needed for nausea or vomiting., Disp: 20  tablet, Rfl: 0  Current Facility-Administered Medications:    medroxyPROGESTERone (DEPO-PROVERA) injection 150 mg, 150 mg, Intramuscular, Q90 days, Hermina Staggers, MD, 150 mg at 12/29/20 0900  Observations/Objective: Patient is well-developed, well-nourished in no acute distress.  Resting comfortably in her car.  Head is normocephalic, atraumatic.  No labored breathing. Speech is clear and coherent with logical content.  Patient is alert and oriented at baseline.    Assessment and Plan: 1. Encounter to obtain excuse from work   Follow Up Instructions: I discussed the assessment and treatment plan with the patient. The patient was provided an opportunity to ask questions and all were answered. The patient agreed with the plan and demonstrated an understanding of the instructions.  A copy of instructions were sent to the patient via MyChart unless otherwise noted below.     The patient was advised to call back or seek an in-person evaluation if the symptoms worsen or if the condition fails to improve as anticipated.  Time:  I spent 7 minutes with the patient via telehealth technology discussing the above problems/concerns.    Roxy Horseman, PA-C

## 2021-11-01 ENCOUNTER — Other Ambulatory Visit: Payer: Self-pay

## 2021-11-01 ENCOUNTER — Ambulatory Visit (INDEPENDENT_AMBULATORY_CARE_PROVIDER_SITE_OTHER): Payer: Medicaid Other | Admitting: *Deleted

## 2021-11-01 VITALS — BP 120/79 | HR 84

## 2021-11-01 DIAGNOSIS — Z3042 Encounter for surveillance of injectable contraceptive: Secondary | ICD-10-CM | POA: Diagnosis not present

## 2021-11-01 LAB — POCT URINE PREGNANCY: Preg Test, Ur: NEGATIVE

## 2021-11-01 MED ORDER — MEDROXYPROGESTERONE ACETATE 150 MG/ML IM SUSP
150.0000 mg | Freq: Once | INTRAMUSCULAR | Status: AC
  Administered 2021-11-01: 150 mg via INTRAMUSCULAR

## 2021-11-01 NOTE — Progress Notes (Signed)
Date last pap: 12/07/19. Last Depo-Provera: 12/29/20. Side Effects if any: NA. Serum HCG indicated? Negative. Depo-Provera 150 mg IM given by: Selena Batten. Joetta Delprado, RNC in Right Deltoid. Next appointment due 01/17/22-01/31/22.

## 2022-01-17 ENCOUNTER — Encounter (HOSPITAL_COMMUNITY): Payer: Self-pay

## 2022-01-17 ENCOUNTER — Ambulatory Visit
Admission: EM | Admit: 2022-01-17 | Discharge: 2022-01-17 | Disposition: A | Discharge status: Home / Self Care | Payer: Medicaid Other | Attending: Physician Assistant | Admitting: Physician Assistant

## 2022-01-17 ENCOUNTER — Other Ambulatory Visit: Payer: Self-pay

## 2022-01-17 ENCOUNTER — Emergency Department (HOSPITAL_COMMUNITY)
Admission: EM | Admit: 2022-01-17 | Discharge: 2022-01-17 | Disposition: A | Discharge status: Home / Self Care | Payer: Medicaid Other | Attending: Emergency Medicine | Admitting: Emergency Medicine

## 2022-01-17 VITALS — BP 141/86 | HR 84 | Temp 97.9°F | Resp 18

## 2022-01-17 DIAGNOSIS — M545 Low back pain, unspecified: Secondary | ICD-10-CM

## 2022-01-17 DIAGNOSIS — Z5321 Procedure and treatment not carried out due to patient leaving prior to being seen by health care provider: Secondary | ICD-10-CM | POA: Insufficient documentation

## 2022-01-17 MED ORDER — PREDNISONE 20 MG PO TABS: 40.0000 mg | ORAL_TABLET | Freq: Every day | ORAL | 0 refills | Status: AC

## 2022-01-17 MED ORDER — CYCLOBENZAPRINE HCL 10 MG PO TABS: 10.0000 mg | ORAL_TABLET | Freq: Two times a day (BID) | ORAL | 0 refills | Status: AC | PRN
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
## 2022-01-17 NOTE — ED Notes (Signed)
Pt was seen leaving the facility.

## 2022-01-24 ENCOUNTER — Ambulatory Visit: Payer: Medicaid Other

## 2022-01-31 ENCOUNTER — Ambulatory Visit: Payer: Medicaid Other

## 2022-02-01 ENCOUNTER — Ambulatory Visit (INDEPENDENT_AMBULATORY_CARE_PROVIDER_SITE_OTHER): Payer: Medicaid Other

## 2022-02-01 ENCOUNTER — Other Ambulatory Visit: Payer: Self-pay

## 2022-02-01 DIAGNOSIS — Z3042 Encounter for surveillance of injectable contraceptive: Secondary | ICD-10-CM | POA: Diagnosis not present

## 2022-02-01 MED ORDER — MEDROXYPROGESTERONE ACETATE 150 MG/ML IM SUSP
150.0000 mg | Freq: Once | INTRAMUSCULAR | Status: AC
  Administered 2022-02-01: 150 mg via INTRAMUSCULAR

## 2022-02-01 NOTE — Progress Notes (Cosign Needed)
Patient presents for depo injection. Patient is 1 day outside of her window. Injection given per protocol. Given in LUOQ. Patient tolerated well. Next depo due 5/17-5/31.  ?

## 2022-02-01 NOTE — Progress Notes (Signed)
Agree with nurses's documentation of this patient's clinic encounter.  Tabius Rood L, MD  

## 2022-04-25 ENCOUNTER — Ambulatory Visit: Payer: Medicaid Other

## 2022-05-10 ENCOUNTER — Ambulatory Visit: Payer: Medicaid Other

## 2022-05-11 ENCOUNTER — Ambulatory Visit: Payer: Medicaid Other

## 2022-11-12 ENCOUNTER — Ambulatory Visit: Payer: Self-pay

## 2022-11-13 ENCOUNTER — Ambulatory Visit (HOSPITAL_COMMUNITY): Payer: Self-pay

## 2023-01-10 ENCOUNTER — Ambulatory Visit: Payer: Self-pay

## 2023-06-22 ENCOUNTER — Telehealth: Payer: Medicaid Other | Admitting: Nurse Practitioner

## 2023-06-22 ENCOUNTER — Encounter: Payer: Self-pay | Admitting: Nurse Practitioner

## 2023-06-22 DIAGNOSIS — J40 Bronchitis, not specified as acute or chronic: Secondary | ICD-10-CM | POA: Diagnosis not present

## 2023-06-22 MED ORDER — CETIRIZINE HCL 10 MG PO TABS: 10.0000 mg | ORAL_TABLET | Freq: Every day | ORAL | 0 refills | Status: DC

## 2023-06-22 MED ORDER — PREDNISONE 10 MG (21) PO TBPK: ORAL_TABLET | ORAL | 0 refills | Status: DC

## 2023-06-22 MED ORDER — BENZONATATE 100 MG PO CAPS: 100.0000 mg | ORAL_CAPSULE | Freq: Three times a day (TID) | ORAL | 0 refills | Status: DC | PRN

## 2023-06-22 NOTE — Progress Notes (Signed)
Virtual Visit Consent   Megan Ballard, you are scheduled for a virtual visit with a [facility] provider today. Just as with appointments in the office, your consent must be obtained to participate. Your consent will be active for this visit and any virtual visit you may have with one of our providers in the next 365 days. If you have a MyChart account, a copy of this consent can be sent to you electronically.  As this is a virtual visit, video technology does not allow for your provider to perform a traditional examination. This may limit your provider's ability to fully assess your condition. If your provider identifies any concerns that need to be evaluated in person or the need to arrange testing (such as labs, EKG, etc.), we will make arrangements to do so. Although advances in technology are sophisticated, we cannot ensure that it will always work on either your end or our end. If the connection with a video visit is poor, the visit may have to be switched to a telephone visit. With either a video or telephone visit, we are not always able to ensure that we have a secure connection.  By engaging in this virtual visit, you consent to the provision of healthcare and authorize for your insurance to be billed (if applicable) for the services provided during this visit. Depending on your insurance coverage, you may receive a charge related to this service.  I need to obtain your verbal consent now. Are you willing to proceed with your visit today? VIHA KRIEGEL has provided verbal consent on 06/22/2023 for a virtual visit (video or telephone). Viviano Simas, FNP  Date: 06/22/2023 2:18 PM  Virtual Visit via Video Note   I, Viviano Simas, connected with  SHAYLI ALTEMOSE  ([id number], 05-Apr-1981) on 06/22/23 at  2:15 PM EDT by a video-enabled telemedicine application and verified that I am speaking with the correct person using two identifiers.  Location: Patient: Virtual Visit Location  Patient: Home Provider: Virtual Visit Location Provider: Home Office   I discussed the limitations of evaluation and management by telemedicine and the availability of in person appointments. The patient expressed understanding and agreed to proceed.    History of Present Illness: Megan Ballard is a 42 y.o. who identifies as a female who was assigned female at birth, and is being seen today for a cough  She has been sick for the past week and has been persistent   She has a headache, her voice feels scratchy, her cough sounds like a bark   She recently traveled to [place] that started her allergies to exacerbate  Returned 06/13/23  She has tried sinus and allergy medicine over the counter   Denies a fever Cough is dry and barky  Non productive   Denies a history of asthma   Denies pregnancy or breastfeeding   Problems:  Patient Active Problem List   Diagnosis Date Noted   UTI (lower urinary tract infection) postpartum 09/06/2012   Likely kidney stone 9/13. 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 (Patient not taking: Reported on 10/11/2021), Disp: 90 tablet, Rfl: 0   ibuprofen (ADVIL) 800 MG tablet, Take 1 tablet (800 mg total) by mouth 3 (three) times daily., Disp: 21 tablet, Rfl: 0   medroxyPROGESTERone (DEPO-PROVERA) 150 MG/ML injection,  Inject 1 mL (150 mg total) into the muscle every 3 (three) months., Disp: 1 mL, Rfl: 5   medroxyPROGESTERone (DEPO-PROVERA) 150 MG/ML injection, Inject 1 mL (150 mg total) into the muscle every 3 (three) months., Disp: 1 mL, Rfl: 4   ondansetron (ZOFRAN) 4 MG tablet, Take 1 tablet (4 mg total) by mouth every 8 (eight) hours as needed for nausea or vomiting., Disp: 20 tablet, Rfl: 0  Current Facility-Administered Medications:    medroxyPROGESTERone (DEPO-PROVERA) injection 150 mg, 150 mg, Intramuscular, Q90 days, Hermina Staggers, MD, 150 mg at 12/29/20 0900  Observations/Objective: Patient is well-developed, well-nourished in no acute distress.  Resting comfortably  at home.  Head is normocephalic, atraumatic.  No labored breathing.  Speech is clear and coherent with logical content.  Patient is alert and oriented at baseline.    Assessment and Plan: 1. Bronchitis  Meds ordered this encounter  Medications   benzonatate (TESSALON) 100 MG capsule    Sig: Take 1 capsule (100 mg total) by mouth 3 (three) times daily as needed.    Dispense:  30 capsule    Refill:  0   predniSONE (STERAPRED UNI-PAK 21 TAB) 10 MG (21) TBPK tablet    Sig: Take 6 tablets on day one, 5 on day two, 4 on day three, 3 on day four, 2 on day five, and 1 on day six. Take with food.    Dispense:  21 tablet    Refill:  0   cetirizine (ZYRTEC ALLERGY) 10 MG tablet    Sig: Take 1 tablet (10 mg total) by mouth at bedtime.    Dispense:  30 tablet    Refill:  0          Follow Up Instructions: I discussed the assessment and treatment plan with the patient. The patient was provided an opportunity to ask questions and all were answered. The patient agreed with the plan and demonstrated an understanding of the instructions.  A copy of instructions were sent to the patient via MyChart unless otherwise noted below.   The patient was advised to call back or seek an in-person evaluation if the symptoms worsen or if the  condition fails to improve as anticipated.  Time:  I spent 15 minutes with the patient via telehealth technology discussing the above problems/concerns.    Viviano Simas, FNP

## 2023-06-28 ENCOUNTER — Ambulatory Visit: Payer: Self-pay

## 2023-06-29 ENCOUNTER — Ambulatory Visit: Payer: Self-pay

## 2023-06-30 ENCOUNTER — Ambulatory Visit: Payer: Medicaid Other

## 2023-06-30 ENCOUNTER — Ambulatory Visit
Admission: EM | Admit: 2023-06-30 | Discharge: 2023-06-30 | Disposition: A | Discharge status: Home / Self Care | Payer: Medicaid Other | Attending: Internal Medicine | Admitting: Internal Medicine

## 2023-06-30 DIAGNOSIS — R053 Chronic cough: Secondary | ICD-10-CM

## 2023-06-30 MED ORDER — AZITHROMYCIN 250 MG PO TABS
ORAL_TABLET | ORAL | 0 refills | Status: AC
Start: 2023-06-30 — End: 2023-07-05

## 2023-06-30 MED ORDER — PROMETHAZINE-DM 6.25-15 MG/5ML PO SYRP: 5.0000 mL | ORAL_SOLUTION | Freq: Four times a day (QID) | ORAL | 0 refills | Status: DC | PRN

## 2023-06-30 NOTE — ED Triage Notes (Signed)
Pt presents with cough x 3 weeks, Pt reports the cough is worse at night. Pt was prescribed prednisone but this has not help her cough.

## 2023-06-30 NOTE — ED Provider Notes (Signed)
[name]    CSN: [id number] Arrival date & time: 06/30/23  1144      History   Chief Complaint Chief Complaint  Patient presents with   Cough    HPI Megan Ballard is a 42 y.o. female.   Patient presents with persistent cough that has been present for about 3 weeks.  Reports that she has had little bit of productive cough with green sputum at times but it is mainly dry.  She reports that she had a sore throat which is now resolved but denies any associated upper respiratory symptoms or fever.  Her daughter has had similar symptoms.  She was seen via video visit on 06/22/2023 and prescribed prednisone, cetirizine, cough medication.  Reports the cough medication was not covered by her insurance but she did take the prednisone with minimal improvement.  Denies chest pain or shortness of breath.  Denies history of asthma or COPD and patient denies that she smokes cigarettes.   Cough   Past Medical History:  Diagnosis Date   Abnormal Pap smear 2006   Anemia 2002   Anxiety    Bacterial infection    H/O candidiasis    H/O chlamydia infection 2010   H/O cystitis 2012   H/O pyelonephritis 2011   H/O varicella    History of GBS (group B streptococcus) UTI, currently pregnant 2002   Hyperemesis    Vaginal Pap smear, abnormal    Yeast infection     Patient Active Problem List   Diagnosis Date Noted   UTI (lower urinary tract infection) postpartum 09/06/2012   Likely kidney stone 9/13. 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 Authorizing Provider  azithromycin (ZITHROMAX Z-PAK) 250 MG tablet Take 2 tablets (500 mg total) by mouth daily for 1 day, THEN 1 tablet (250 mg total) daily for 4 days. 06/30/23 07/05/23 Yes Mohogany Toppins, Acie Fredrickson, FNP  benzonatate (TESSALON) 100 MG capsule Take 1 capsule (100 mg total) by mouth 3 (three) times daily as needed. 06/22/23  Yes Viviano Simas, FNP  predniSONE (STERAPRED UNI-PAK 21 TAB) 10 MG (21) TBPK tablet Take 6 tablets on day one, 5 on day two, 4 on day three, 3 on day four, 2 on day five, and 1 on day six. Take with food. 06/22/23  Yes Viviano Simas, FNP  promethazine-dextromethorphan (PROMETHAZINE-DM) 6.25-15 MG/5ML syrup Take 5 mLs by mouth every 6 (six) hours as needed for cough. 06/30/23  Yes Lief Palmatier, Acie Fredrickson, FNP  cetirizine (ZYRTEC ALLERGY) 10 MG tablet Take 1 tablet (10  mg total) by mouth at bedtime. 06/22/23 07/22/23  Viviano Simas, FNP  cyclobenzaprine (FLEXERIL) 10 MG tablet Take 1 tablet (10 mg total) by mouth 2 (two) times daily as needed for muscle spasms. 01/17/22   Tomi Bamberger, PA-C  escitalopram (LEXAPRO) 10 MG tablet Take 1 tablet (10 mg total) by mouth daily. Patient not taking: Reported on 10/11/2021 09/03/19   Wallis Bamberg, PA-C  ibuprofen (ADVIL) 800 MG tablet Take 1 tablet (800 mg total) by mouth 3 (three) times daily. 03/11/20   Wieters, Hallie C, PA-C  medroxyPROGESTERone (DEPO-PROVERA) 150 MG/ML injection Inject 1 mL (150 mg total) into the muscle every 3 (three) months. 10/11/21   Constant, Peggy, MD  medroxyPROGESTERone (DEPO-PROVERA) 150 MG/ML injection Inject 1 mL (150 mg total) into the muscle every 3 (three) months. 10/11/21   Constant, Peggy, MD  ondansetron (ZOFRAN) 4 MG tablet Take 1 tablet (4 mg total) by mouth every 8 (eight) hours  as needed for nausea or vomiting. 10/30/21   Wurst, [place], PA-C  albuterol (PROVENTIL HFA;VENTOLIN HFA) 108 (90 Base) MCG/ACT inhaler Inhale 2 puffs into the lungs every 4 (four) hours as needed for wheezing or shortness of breath (cough, shortness of breath or wheezing.). 04/17/18 09/03/19  Elvina Sidle, MD    Family History Family History  Problem Relation Age of Onset   Mental illness Father    Hypertension Maternal Aunt    Diabetes Maternal Aunt    Kidney disease Maternal Aunt    Hypertension Maternal Uncle    Kidney disease Maternal Uncle    Alcohol abuse Maternal Uncle    Heart disease Maternal Grandmother    Hypertension Maternal Grandmother    Diabetes Maternal Grandmother    Kidney disease Maternal Grandmother    Kidney disease Cousin    Anesthesia problems Neg Hx     Social History Social History   Tobacco Use   Smoking status: Never   Smokeless tobacco: Never  Vaping Use   Vaping status: Never Used  Substance Use Topics   Alcohol use: No   Drug use: No     Allergies   Latex   Review of Systems Review of Systems Per HPI  Physical Exam Triage Vital Signs ED Triage Vitals [06/30/23 1222]  Encounter Vitals Group     BP 110/69     Systolic BP Percentile      Diastolic BP Percentile      Pulse Rate 89     Resp 16     Temp 97.9 F (36.6 C)     Temp Source Oral     SpO2 97 %     Weight      Height      Head Circumference      Peak Flow      Pain Score      Pain Loc      Pain Education      Exclude from Growth Chart    No data found.  Updated Vital Signs BP 110/69 (BP Location: Left Arm)   Pulse 89   Temp 97.9 F (36.6 C) (Oral)   Resp 16   LMP 06/04/2023 (Approximate)   SpO2 97%   Visual Acuity Right Eye Distance:   Left Eye Distance:   Bilateral Distance:    Right Eye Near:   Left Eye Near:    Bilateral Near:     Physical Exam Constitutional:      General: She is not in acute distress.    Appearance: Normal appearance.  She is not toxic-appearing or diaphoretic.  HENT:     Head: Normocephalic and atraumatic.     Right Ear: Tympanic membrane and ear canal normal.     Left Ear: Tympanic membrane and ear canal normal.     Nose: No congestion.     Mouth/Throat:     Mouth: Mucous membranes are moist.     Pharynx: No posterior oropharyngeal erythema.  Eyes:     Extraocular Movements: Extraocular movements intact.     Conjunctiva/sclera: Conjunctivae normal.     Pupils: Pupils are equal, round, and reactive to light.  Cardiovascular:     Rate and Rhythm: Normal rate and regular rhythm.     Pulses: Normal pulses.     Heart sounds: Normal heart sounds.  Pulmonary:     Effort: Pulmonary effort is normal. No respiratory distress.     Breath sounds: Normal breath sounds. No stridor. No wheezing, rhonchi or rales.  Abdominal:     General: Abdomen is flat. Bowel sounds are normal.     Palpations: Abdomen is soft.  Musculoskeletal:        General: Normal range of motion.     Cervical back: Normal range of motion.  Skin:    General: Skin is warm and dry.  Neurological:     General: No focal deficit present.     Mental Status: She is alert and oriented to person, place, and time. Mental status is at baseline.  Psychiatric:        Mood and Affect: Mood normal.        Behavior: Behavior normal.      UC Treatments / Results  Labs (all labs ordered are listed, but only abnormal results are displayed) Labs Reviewed - No data to display  EKG   Radiology DG Chest 2 View  Result Date: 06/30/2023 CLINICAL DATA:  Cough for 3 weeks. EXAM: CHEST - 2 VIEW COMPARISON:  None Available. FINDINGS: The heart size and mediastinal contours are within normal limits. Both lungs are clear. The visualized skeletal structures are unremarkable. IMPRESSION: No active cardiopulmonary disease. Electronically Signed   By: Danae Orleans M.D.   On: 06/30/2023 13:12    Procedures Procedures (including critical care  time)  Medications Ordered in UC Medications - No data to display  Initial Impression / Assessment and Plan / UC Course  I have reviewed the triage vital signs and the nursing notes.  Pertinent labs & imaging results that were available during my care of the patient were reviewed by me and considered in my medical decision making (see chart for details).     Chest x-ray completed that was negative for any acute cardiopulmonary process.  Suspect possible bronchitis.  Will treat with azithromycin to cover for any atypicals.  Promethazine DM prescribed to take as needed for cough and patient advised this can make her drowsy and do not drive or drink while taking it.  Advised to follow up with PCP if symptoms persist or worsen.  Patient verbalized understanding and was agreeable with plan. Final Clinical Impressions(s) / UC Diagnoses   Final diagnoses:  Persistent cough for 3 weeks or longer     Discharge Instructions      I have prescribed an antibiotic and a cough medication to take for current cough.  Cough medication can make you drowsy so no driving or drinking alcohol with it.  Follow-up with your primary care doctor if cough persists or worsens.     ED Prescriptions  Medication Sig Dispense Auth. Provider   azithromycin (ZITHROMAX Z-PAK) 250 MG tablet Take 2 tablets (500 mg total) by mouth daily for 1 day, THEN 1 tablet (250 mg total) daily for 4 days. 6 tablet [place], Tracyton E, [place]   promethazine-dextromethorphan (PROMETHAZINE-DM) 6.25-15 MG/5ML syrup Take 5 mLs by mouth every 6 (six) hours as needed for cough. 118 mL Gustavus Bryant, [place]      PDMP not reviewed this encounter.   Gustavus Bryant, [place] 06/30/23 1335

## 2023-06-30 NOTE — Discharge Instructions (Signed)
I have prescribed an antibiotic and a cough medication to take for current cough.  Cough medication can make you drowsy so no driving or drinking alcohol with it.  Follow-up with your primary care doctor if cough persists or worsens.

## 2023-10-23 ENCOUNTER — Telehealth: Payer: Medicaid Other | Admitting: Physician Assistant

## 2023-10-23 DIAGNOSIS — F419 Anxiety disorder, unspecified: Secondary | ICD-10-CM | POA: Diagnosis not present

## 2023-10-23 DIAGNOSIS — F32A Depression, unspecified: Secondary | ICD-10-CM | POA: Diagnosis not present

## 2023-10-23 MED ORDER — ESCITALOPRAM OXALATE 10 MG PO TABS: 10.0000 mg | ORAL_TABLET | Freq: Every day | ORAL | 1 refills | Status: AC

## 2023-10-23 NOTE — Patient Instructions (Signed)
Megan Ballard, thank you for joining Gilberto Better, PA-C for today's virtual visit.  While this provider is not your primary care provider (PCP), if your PCP is located in our provider database this encounter information will be shared with them immediately following your visit.   A [facility] MyChart account gives you access to today's visit and all your visits, tests, and labs performed at [facility] " click here if you don't have a [facility] MyChart account or go to [email or web address]  Consent: (Patient) Megan Ballard provided verbal consent for this virtual visit at the beginning of the encounter.  Current Medications:  Current Outpatient Medications:    escitalopram (LEXAPRO) 10 MG tablet, Take 1 tablet (10 mg total) by mouth daily. Start with 1/2 tablet once daily x 6 days then one tablet once daily for the following days, Disp: 30 tablet, Rfl: 1   benzonatate (TESSALON) 100 MG capsule, Take 1 capsule (100 mg total) by mouth 3 (three) times daily as needed., Disp: 30 capsule, Rfl: 0   cetirizine (ZYRTEC ALLERGY) 10 MG tablet, Take 1 tablet (10 mg total) by mouth at bedtime., Disp: 30 tablet, Rfl: 0   cyclobenzaprine (FLEXERIL) 10 MG tablet, Take 1 tablet (10 mg total) by mouth 2 (two) times daily as needed for muscle spasms., Disp: 20 tablet, Rfl: 0   ibuprofen (ADVIL) 800 MG tablet, Take 1 tablet (800 mg total) by mouth 3 (three) times daily., Disp: 21 tablet, Rfl: 0   medroxyPROGESTERone (DEPO-PROVERA) 150 MG/ML injection, Inject 1 mL (150 mg total) into the muscle every 3 (three) months., Disp: 1 mL, Rfl: 5   medroxyPROGESTERone (DEPO-PROVERA) 150 MG/ML injection, Inject 1 mL (150 mg total) into the muscle every 3 (three) months., Disp: 1 mL, Rfl: 4   ondansetron (ZOFRAN) 4 MG tablet, Take 1 tablet (4 mg total) by mouth every 8 (eight) hours as needed for nausea or vomiting., Disp: 20 tablet, Rfl: 0   predniSONE (STERAPRED UNI-PAK 21 TAB) 10 MG (21)  TBPK tablet, Take 6 tablets on day one, 5 on day two, 4 on day three, 3 on day four, 2 on day five, and 1 on day six. Take with food., Disp: 21 tablet, Rfl: 0   promethazine-dextromethorphan (PROMETHAZINE-DM) 6.25-15 MG/5ML syrup, Take 5 mLs by mouth every 6 (six) hours as needed for cough., Disp: 118 mL, Rfl: 0  Current Facility-Administered Medications:    medroxyPROGESTERone (DEPO-PROVERA) injection 150 mg, 150 mg, Intramuscular, Q90 days, Hermina Staggers, MD, 150 mg at 12/29/20 0900   Medications ordered in this encounter:  Meds ordered this encounter  Medications   escitalopram (LEXAPRO) 10 MG tablet    Sig: Take 1 tablet (10 mg total) by mouth daily. Start with 1/2 tablet once daily x 6 days then one tablet once daily for the following days    Dispense:  30 tablet    Refill:  1    Order Specific Question:   Supervising Provider    Answer:   Merrilee Jansky [[id number]]     *If you need refills on other medications prior to your next appointment, please contact your pharmacy*  Follow-Up: Call back or seek an in-person evaluation if the symptoms worsen or if the condition fails to improve as anticipated.  [facility] [phone]  Other Instructions Highly recommended to seek therapy through insurance as discussed Start medicine as prescribed.  Reviewed common side effects of the medicine and patient verbalized understanding. Follow up with PCP for  further evaluation and management.   If you have been instructed to have an in-person evaluation today at a local Urgent Care facility, please use the link below. It will take you to a list of all of our available [facility], including address, phone number and hours of operation. Please do not delay care.  [facility]  If you or a family member do not have a primary care provider, use the link below to schedule a visit and establish care. When you choose a [facility] primary care physician  or advanced practice provider, you gain a long-term partner in health. Find a Primary Care Provider  Learn more about 's in-office and virtual care options:  - Get Care Now

## 2023-10-23 NOTE — Addendum Note (Signed)
Addended by: Gilberto Better on: 10/23/2023 06:41 PM   Modules accepted: Orders

## 2023-10-23 NOTE — Progress Notes (Addendum)
Virtual Visit Consent   Megan Ballard, you are scheduled for a virtual visit with a [facility] provider today. Just as with appointments in the office, your consent must be obtained to participate. Your consent will be active for this visit and any virtual visit you may have with one of our providers in the next 365 days. If you have a MyChart account, a copy of this consent can be sent to you electronically.  As this is a virtual visit, video technology does not allow for your provider to perform a traditional examination. This may limit your provider's ability to fully assess your condition. If your provider identifies any concerns that need to be evaluated in person or the need to arrange testing (such as labs, EKG, etc.), we will make arrangements to do so. Although advances in technology are sophisticated, we cannot ensure that it will always work on either your end or our end. If the connection with a video visit is poor, the visit may have to be switched to a telephone visit. With either a video or telephone visit, we are not always able to ensure that we have a secure connection.  By engaging in this virtual visit, you consent to the provision of healthcare and authorize for your insurance to be billed (if applicable) for the services provided during this visit. Depending on your insurance coverage, you may receive a charge related to this service.  I need to obtain your verbal consent now. Are you willing to proceed with your visit today? Megan Ballard has provided verbal consent on 10/23/2023 for a virtual visit (video or telephone). Gilberto Better, [place]  Date: 10/23/2023 6:41 PM  Virtual Visit via Video Note   I, Gilberto Better, connected with  Megan Ballard  ([id number], Oct 30, 1981) on 10/23/23 at  4:30 PM EST by a video-enabled telemedicine application and verified that I am speaking with the correct person using two identifiers.  Location: Patient: Virtual Visit  Location Patient: Home Provider: Virtual Visit Location Provider: Home Office   I discussed the limitations of evaluation and management by telemedicine and the availability of in person appointments. The patient expressed understanding and agreed to proceed.    History of Present Illness: Megan Ballard is a 42 y.o. who identifies as a female who was assigned female at birth, and is being seen today for depression. Has started new job and daughter   HPI: 42 y/o F presents via telehealth for anxiety and depression symptoms. Pt started a new job, but at home her youngest daughter is going through medical testing for auto-immune disease. Pt with h/o anxiety&depression on Escitalopram 10mg  about a year ago. Recently has sought therapist, but awaiting an appointment to be scheduled. She denies SI or HI.     Problems:  Patient Active Problem List   Diagnosis Date Noted   Lower urinary tract infectious disease 09/06/2012   Likely kidney stone 9/13. 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 Start with 1/2 tablet once daily x  6 days then one tablet once daily for the following days, Disp: 30 tablet, Rfl: 1   benzonatate (TESSALON) 100 MG capsule, Take 1 capsule (100 mg total) by mouth 3 (three) times daily as needed., Disp: 30 capsule, Rfl: 0   cetirizine (ZYRTEC ALLERGY) 10 MG tablet, Take 1 tablet (10 mg total) by mouth at bedtime., Disp: 30 tablet, Rfl: 0   cyclobenzaprine (FLEXERIL) 10 MG tablet, Take 1 tablet (10 mg  total) by mouth 2 (two) times daily as needed for muscle spasms., Disp: 20 tablet, Rfl: 0   ibuprofen (ADVIL) 800 MG tablet, Take 1 tablet (800 mg total) by mouth 3 (three) times daily., Disp: 21 tablet, Rfl: 0   medroxyPROGESTERone (DEPO-PROVERA) 150 MG/ML injection, Inject 1 mL (150 mg total) into the muscle every 3 (three) months., Disp: 1 mL, Rfl: 5   medroxyPROGESTERone (DEPO-PROVERA) 150 MG/ML injection, Inject 1 mL (150 mg total) into the muscle every 3 (three) months., Disp: 1 mL, Rfl: 4   ondansetron (ZOFRAN) 4 MG tablet, Take 1 tablet (4 mg total) by mouth every 8 (eight) hours as needed for nausea or vomiting., Disp: 20 tablet, Rfl: 0   predniSONE (STERAPRED UNI-PAK 21 TAB) 10 MG (21) TBPK tablet, Take 6 tablets on day one, 5 on day two, 4 on day three, 3 on day four, 2 on day five, and 1 on day six. Take with food., Disp: 21 tablet, Rfl: 0   promethazine-dextromethorphan (PROMETHAZINE-DM) 6.25-15 MG/5ML syrup, Take 5 mLs by mouth every 6 (six) hours as needed for cough., Disp: 118 mL, Rfl: 0  Current Facility-Administered Medications:    medroxyPROGESTERone (DEPO-PROVERA) injection 150 mg, 150 mg, Intramuscular, Q90 days, Hermina Staggers, MD, 150 mg at 12/29/20 0900  Observations/Objective: Patient is well-developed, well-nourished in no acute distress.  Resting comfortably  at home.  Head is normocephalic, atraumatic.  No labored breathing.  Speech is clear and coherent with logical content.  Patient is alert and oriented at baseline.    Assessment and Plan: 1. Anxiety and depression - escitalopram (LEXAPRO) 10 MG tablet; Take 1 tablet (10 mg total) by mouth daily. Start with 1/2 tablet once daily x 6 days then one tablet once daily for the following days  Dispense: 30 tablet; Refill: 1 - Ambulatory referral to Psychiatry  PHQ-9 score of 13 Highly recommended to seek therapy through insurance as discussed Start medicine as prescribed.  Reviewed common side effects of the  medicine and patient verbalized understanding. Follow up with PCP for further evaluation and management.  Follow Up Instructions: I discussed the assessment and treatment plan with the patient. The patient was provided an opportunity to ask questions and all were answered. The patient agreed with the plan and demonstrated an understanding of the instructions.  A copy of instructions were sent to the patient via MyChart unless otherwise noted below.   Patient has requested to receive PHI (AVS, Work Notes, etc) pertaining to this video visit through e-mail as they are currently without active MyChart. They have voiced understand that email is not considered secure and their health information could be viewed by someone other than the patient.   The patient was advised to call back or seek an in-person evaluation if the symptoms worsen or if the condition fails to improve as anticipated.    Gilberto Better, PA-C

## 2023-12-13 ENCOUNTER — Telehealth: Payer: Medicaid Other | Admitting: Physician Assistant

## 2023-12-13 DIAGNOSIS — R3989 Other symptoms and signs involving the genitourinary system: Secondary | ICD-10-CM

## 2023-12-13 DIAGNOSIS — B3731 Acute candidiasis of vulva and vagina: Secondary | ICD-10-CM | POA: Diagnosis not present

## 2023-12-13 MED ORDER — CEPHALEXIN 500 MG PO CAPS: 500.0000 mg | ORAL_CAPSULE | Freq: Two times a day (BID) | ORAL | 0 refills | Status: AC

## 2023-12-13 MED ORDER — FLUCONAZOLE 150 MG PO TABS
ORAL_TABLET | ORAL | 0 refills | Status: DC
Start: 2023-12-13 — End: 2024-08-31

## 2023-12-13 NOTE — Patient Instructions (Addendum)
 Megan Ballard, thank you for joining [name], PA-C for today's virtual visit.  While this provider is not your primary care provider (PCP), if your PCP is located in our provider database this encounter information will be shared with them immediately following your visit.   A [facility] MyChart account gives you access to today's visit and all your visits, tests, and labs performed at [facility]  click here if you don't have a [facility] MyChart account or go to [email or web address]  Consent: (Patient) Megan Ballard provided verbal consent for this virtual visit at the beginning of the encounter.  Current Medications:  Current Outpatient Medications:    cephALEXin  (KEFLEX ) 500 MG capsule, Take 1 capsule (500 mg total) by mouth 2 (two) times daily for 7 days., Disp: 14 capsule, Rfl: 0   fluconazole  (DIFLUCAN ) 150 MG tablet, Take 1 tablet PO once. Repeat in 3 days if needed., Disp: 2 tablet, Rfl: 0   cetirizine  (ZYRTEC  ALLERGY) 10 MG tablet, Take 1 tablet (10 mg total) by mouth at bedtime., Disp: 30 tablet, Rfl: 0   cyclobenzaprine  (FLEXERIL ) 10 MG tablet, Take 1 tablet (10 mg total) by mouth 2 (two) times daily as needed for muscle spasms., Disp: 20 tablet, Rfl: 0   escitalopram  (LEXAPRO ) 10 MG tablet, Take 1 tablet (10 mg total) by mouth daily. Start with 1/2 tablet once daily x 6 days then one tablet once daily for the following days, Disp: 30 tablet, Rfl: 1   medroxyPROGESTERone  (DEPO-PROVERA ) 150 MG/ML injection, Inject 1 mL (150 mg total) into the muscle every 3 (three) months., Disp: 1 mL, Rfl: 4   ondansetron  (ZOFRAN ) 4 MG tablet, Take 1 tablet (4 mg total) by mouth every 8 (eight) hours as needed for nausea or vomiting., Disp: 20 tablet, Rfl: 0  Current Facility-Administered Medications:    medroxyPROGESTERone  (DEPO-PROVERA ) injection 150 mg, 150 mg, Intramuscular, Q90 days, Ervin, Michael L, MD, 150 mg at 12/29/20 0900   Medications  ordered in this encounter:  Meds ordered this encounter  Medications   cephALEXin  (KEFLEX ) 500 MG capsule    Sig: Take 1 capsule (500 mg total) by mouth 2 (two) times daily for 7 days.    Dispense:  14 capsule    Refill:  0    Supervising Provider:   LAMPTEY, PHILIP O [[id number]]   fluconazole  (DIFLUCAN ) 150 MG tablet    Sig: Take 1 tablet PO once. Repeat in 3 days if needed.    Dispense:  2 tablet    Refill:  0    Supervising Provider:   LAMPTEY, PHILIP O [[id number]]     *If you need refills on other medications prior to your next appointment, please contact your pharmacy*  Follow-Up: Call back or seek an in-person evaluation if the symptoms worsen or if the condition fails to improve as anticipated.  [facility] [phone]  Other Instructions Your symptoms are consistent with a bladder infection, also called acute cystitis. Please take your antibiotic (Keflex ) as directed until all pills are gone.  Stay very well hydrated.  Consider a daily probiotic (Align, Culturelle, or Activia) to help prevent stomach upset caused by the antibiotic.  Taking a probiotic daily may also help prevent recurrent UTIs.  Also consider taking AZO (Phenazopyridine ) tablets to help decrease pain with urination.    [place]se the Diflucan  as directed.  If not resolving or you note any new or worsening symptoms despite treatment, please seek an in-person evaluation ASAP.  Urinary Tract Infection A urinary tract infection (UTI) can occur any place along the urinary tract. The tract includes the kidneys, ureters, bladder, and urethra. A type of germ called bacteria often causes a UTI. UTIs are often helped with antibiotic medicine.  HOME CARE  If given, take antibiotics as told by your doctor. Finish them even if you start to feel better. Drink enough fluids to keep your pee (urine) clear or pale yellow. Avoid tea, drinks with caffeine, and bubbly (carbonated) drinks. Pee often. Avoid holding your  pee in for a long time. Pee before and after having sex (intercourse). Wipe from front to back after you poop (bowel movement) if you are a woman. Use each tissue only once. GET HELP RIGHT AWAY IF:  You have back pain. You have lower belly (abdominal) pain. You have chills. You feel sick to your stomach (nauseous). You throw up (vomit). Your burning or discomfort with peeing does not go away. You have a fever. Your symptoms are not better in 3 days. MAKE SURE YOU:  Understand these instructions. Will watch your condition. Will get help right away if you are not doing well or get worse. Document Released: 05/08/2008 Document Revised: 08/14/2012 Document Reviewed: 06/20/2012 [facility] Patient Information 2015 [place], [place]. This information is not intended to replace advice given to you by your health care provider. Make sure you discuss any questions you have with your health care provider.    If you have been instructed to have an in-person evaluation today at a local Urgent Care facility, please use the link below. It will take you to a list of all of our available [facility], including address, phone number and hours of operation. Please do not delay care.  [facility]  If you or a family member do not have a primary care provider, use the link below to schedule a visit and establish care. When you choose a [facility] primary care physician or advanced practice provider, you gain a long-term partner in health. Find a Primary Care Provider  Learn more about [facility]'s in-office and virtual care options: [facility] - Get Care Now

## 2023-12-13 NOTE — Progress Notes (Signed)
 Virtual Visit Consent   Megan Ballard, you are scheduled for a virtual visit with a [facility] provider today. Just as with appointments in the office, your consent must be obtained to participate. Your consent will be active for this visit and any virtual visit you may have with one of our providers in the next 365 days. If you have a MyChart account, a copy of this consent can be sent to you electronically.  As this is a virtual visit, video technology does not allow for your provider to perform a traditional examination. This may limit your provider's ability to fully assess your condition. If your provider identifies any concerns that need to be evaluated in person or the need to arrange testing (such as labs, EKG, etc.), we will make arrangements to do so. Although advances in technology are sophisticated, we cannot ensure that it will always work on either your end or our end. If the connection with a video visit is poor, the visit may have to be switched to a telephone visit. With either a video or telephone visit, we are not always able to ensure that we have a secure connection.  By engaging in this virtual visit, you consent to the provision of healthcare and authorize for your insurance to be billed (if applicable) for the services provided during this visit. Depending on your insurance coverage, you may receive a charge related to this service.  I need to obtain your verbal consent now. Are you willing to proceed with your visit today? SHEIKA COUTTS has provided verbal consent on 12/13/2023 for a virtual visit (video or telephone). Megan Ballard, [place]  Date: 12/13/2023 5:38 PM  Virtual Visit via Video Note   I, Megan Ballard, connected with  NILLIE BARTOLOTTA  ([id number], 02-25-1981) on 12/13/23 at  5:30 PM EST by a video-enabled telemedicine application and verified that I am speaking with the correct person using two identifiers.  Location: Patient: Virtual  Visit Location Patient: Home Provider: Virtual Visit Location Provider: Home Office   I discussed the limitations of evaluation and management by telemedicine and the availability of in person appointments. The patient expressed understanding and agreed to proceed.    History of Present Illness: Megan Ballard is a 43 y.o. who identifies as a female who was assigned female at birth, and is being seen today for possible UTI. Notes strong odor to urine with cloudiness. Denies dysuria,frequency. Notes some associated vaginal itching with mild discharge. Denies fever, chills.  Denies back or belly pain.   HPI: HPI  Problems:  Patient Active Problem List   Diagnosis Date Noted   Lower urinary tract infectious disease 09/06/2012   Likely kidney stone 9/13. 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 Repeat in 3 days if needed., Disp: 2 tablet, Rfl: 0   cetirizine  (ZYRTEC  ALLERGY) 10 MG tablet, Take  1 tablet (10 mg total) by mouth at bedtime., Disp: 30 tablet, Rfl: 0   cyclobenzaprine  (FLEXERIL ) 10 MG tablet, Take 1 tablet (10 mg total) by mouth 2 (two) times daily as needed for muscle spasms., Disp: 20 tablet, Rfl: 0   escitalopram  (LEXAPRO ) 10 MG tablet, Take 1 tablet (10 mg total) by mouth daily. Start with 1/2 tablet once daily x 6 days then one tablet once daily for the following days, Disp: 30 tablet,  Rfl: 1   medroxyPROGESTERone  (DEPO-PROVERA ) 150 MG/ML injection, Inject 1 mL (150 mg total) into the muscle every 3 (three) months., Disp: 1 mL, Rfl: 4   ondansetron  (ZOFRAN ) 4 MG tablet, Take 1 tablet (4 mg total) by mouth every 8 (eight) hours as needed for nausea or vomiting., Disp: 20 tablet, Rfl: 0  Current Facility-Administered Medications:    medroxyPROGESTERone  (DEPO-PROVERA ) injection 150 mg, 150 mg, Intramuscular, Q90 days, Ervin, Michael L, MD, 150 mg at 12/29/20 0900  Observations/Objective: Patient is well-developed, well-nourished in no acute distress.  Resting comfortably at home.  Head is normocephalic, atraumatic.  No labored breathing. Speech is clear and coherent with logical content.  Patient is alert and oriented at baseline.   Assessment and Plan: 1. Suspected UTI (Primary) - cephALEXin  (KEFLEX ) 500 MG capsule; Take 1 capsule (500 mg total) by mouth 2 (two) times daily for 7 days.  Dispense: 14 capsule; Refill: 0  2. Yeast vaginitis - fluconazole  (DIFLUCAN ) 150 MG tablet; Take 1 tablet PO once. Repeat in 3 days if needed.  Dispense: 2 tablet; Refill: 0  Classic UTI symptoms with absence of alarm signs or symptoms. Prior history of UTI. Will treat empirically with Keflex  for suspected uncomplicated cystitis. Will also add on Diflucan  for suspected yeast. Supportive measures and OTC medications reviewed. Strict in-person evaluation precautions discussed.    Follow Up Instructions: I discussed the assessment and treatment plan with the patient. The patient was provided an opportunity to ask questions and all were answered. The patient agreed with the plan and demonstrated an understanding of the instructions.  A copy of instructions were sent to the patient via MyChart unless otherwise noted below.   The patient was advised to call back or seek an in-person evaluation if the symptoms worsen or if the condition fails to improve as anticipated.    Megan Velma Lunger,  PA-C

## 2024-01-21 ENCOUNTER — Telehealth: Payer: Medicaid Other

## 2024-01-22 ENCOUNTER — Telehealth: Payer: Medicaid Other | Admitting: Physician Assistant

## 2024-01-22 DIAGNOSIS — J069 Acute upper respiratory infection, unspecified: Secondary | ICD-10-CM | POA: Diagnosis not present

## 2024-01-22 MED ORDER — PSEUDOEPH-BROMPHEN-DM 30-2-10 MG/5ML PO SYRP: 5.0000 mL | ORAL_SOLUTION | Freq: Four times a day (QID) | ORAL | 0 refills | Status: DC | PRN

## 2024-01-22 MED ORDER — FLUTICASONE PROPIONATE 50 MCG/ACT NA SUSP
2.0000 | Freq: Every day | NASAL | 0 refills | Status: DC
Start: 2024-01-22 — End: 2024-08-31

## 2024-01-22 MED ORDER — ALBUTEROL SULFATE HFA 108 (90 BASE) MCG/ACT IN AERS: 1.0000 | INHALATION_SPRAY | Freq: Four times a day (QID) | RESPIRATORY_TRACT | 0 refills | Status: DC | PRN

## 2024-01-22 NOTE — Patient Instructions (Signed)
 Megan Ballard, thank you for joining Margaretann Loveless, PA-C for today's virtual visit.  While this provider is not your primary care provider (PCP), if your PCP is located in our provider database this encounter information will be shared with them immediately following your visit.   A [facility] MyChart account gives you access to today's visit and all your visits, tests, and labs performed at [facility] " click here if you don't have a [facility] MyChart account or go to [email or web address]  Consent: (Patient) Megan Ballard provided verbal consent for this virtual visit at the beginning of the encounter.  Current Medications:  Current Outpatient Medications:    albuterol (VENTOLIN HFA) 108 (90 Base) MCG/ACT inhaler, Inhale 1-2 puffs into the lungs every 6 (six) hours as needed., Disp: 8 g, Rfl: 0   brompheniramine-pseudoephedrine-DM 30-2-10 MG/5ML syrup, Take 5 mLs by mouth 4 (four) times daily as needed., Disp: 120 mL, Rfl: 0   fluticasone (FLONASE) 50 MCG/ACT nasal spray, Place 2 sprays into both nostrils daily., Disp: 16 g, Rfl: 0   cetirizine (ZYRTEC ALLERGY) 10 MG tablet, Take 1 tablet (10 mg total) by mouth at bedtime., Disp: 30 tablet, Rfl: 0   cyclobenzaprine (FLEXERIL) 10 MG tablet, Take 1 tablet (10 mg total) by mouth 2 (two) times daily as needed for muscle spasms., Disp: 20 tablet, Rfl: 0   escitalopram (LEXAPRO) 10 MG tablet, Take 1 tablet (10 mg total) by mouth daily. Start with 1/2 tablet once daily x 6 days then one tablet once daily for the following days, Disp: 30 tablet, Rfl: 1   fluconazole (DIFLUCAN) 150 MG tablet, Take 1 tablet PO once. Repeat in 3 days if needed., Disp: 2 tablet, Rfl: 0  Current Facility-Administered Medications:    medroxyPROGESTERone (DEPO-PROVERA) injection 150 mg, 150 mg, Intramuscular, Q90 days, Hermina Staggers, MD, 150 mg at 12/29/20 0900   Medications ordered in this encounter:  Meds ordered this encounter   Medications   brompheniramine-pseudoephedrine-DM 30-2-10 MG/5ML syrup    Sig: Take 5 mLs by mouth 4 (four) times daily as needed.    Dispense:  120 mL    Refill:  0    Supervising Provider:   Merrilee Jansky [[id number]]   fluticasone (FLONASE) 50 MCG/ACT nasal spray    Sig: Place 2 sprays into both nostrils daily.    Dispense:  16 g    Refill:  0    Supervising Provider:   Merrilee Jansky [[id number]]   albuterol (VENTOLIN HFA) 108 (90 Base) MCG/ACT inhaler    Sig: Inhale 1-2 puffs into the lungs every 6 (six) hours as needed.    Dispense:  8 g    Refill:  0    Supervising Provider:   Merrilee Jansky [[id number]]     *If you need refills on other medications prior to your next appointment, please contact your pharmacy*  Follow-Up: Call back or seek an in-person evaluation if the symptoms worsen or if the condition fails to improve as anticipated.  [facility] [phone]  Other Instructions  Viral Respiratory Infection A respiratory infection is an illness that affects part of the respiratory system, such as the lungs, nose, or throat. A respiratory infection that is caused by a virus is called a viral respiratory infection. Common types of viral respiratory infections include: A cold. The flu (influenza). A respiratory syncytial virus (RSV) infection. What are the causes? This condition is caused by a virus. The virus may spread  through contact with droplets or direct contact with infected people or their mucus or secretions. The virus may spread from person to person (is contagious). What are the signs or symptoms? Symptoms of this condition include: A stuffy or runny nose. A sore throat or cough. Shortness of breath or difficulty breathing. Yellow or green mucus (sputum). Other symptoms may include: A fever. Sweating or chills. Fatigue. Achy muscles. A headache. How is this diagnosed? This condition may be diagnosed based on: Your symptoms. A  physical exam. Testing of secretions from the nose or throat. Chest X-ray. How is this treated? This condition may be treated with medicines, such as: Antiviral medicine. This may shorten the length of time a person has symptoms. Expectorants. These make it easier to cough up mucus. Decongestant nasal sprays. Acetaminophen or NSAIDs, such as ibuprofen, to relieve fever and pain. Antibiotic medicines are not prescribed for viral infections.This is because antibiotics are designed to kill bacteria. They do not kill viruses. Follow these instructions at home: Managing pain and congestion Take over-the-counter and prescription medicines only as told by your health care provider. If you have a sore throat, gargle with a mixture of salt and water 3-4 times a day or as needed. To make salt water, completely dissolve -1 tsp (3-6 g) of salt in 1 cup (237 mL) of warm water. Use nose drops made from salt water to ease congestion and soften raw skin around your nose. Take 2 tsp (10 mL) of honey at bedtime to lessen coughing at night. Do not give honey to children who are younger than 1 year. Drink enough fluid to keep your urine pale yellow. This helps prevent dehydration and helps loosen up mucus. General instructions  Rest as much as possible. Do not drink alcohol. Do not use any products that contain nicotine or tobacco. These products include cigarettes, chewing tobacco, and vaping devices, such as e-cigarettes. If you need help quitting, ask your health care provider. Keep all follow-up visits. This is important. How is this prevented?     Get an annual flu shot. You may get the flu shot in late summer, fall, or winter. Ask your health care provider when you should get your flu shot. Avoid spreading your infection to other people. If you are sick: Wash your hands with soap and water often, especially after you cough or sneeze. Wash for at least 20 seconds. If soap and water are not available,  use alcohol-based hand sanitizer. Cover your mouth when you cough. Cover your nose and mouth when you sneeze. Do not share cups or eating utensils. Clean commonly used objects often. Clean commonly touched surfaces. Stay home from work or school as told by your health care provider. Avoid contact with people who are sick during cold and flu season. This is generally fall and winter. Contact a health care provider if: Your symptoms last for 10 days or longer. Your symptoms get worse over time. You have severe sinus pain in your face or forehead. The glands in your jaw or neck become very swollen. You have shortness of breath. Get help right away if you: Feel pain or pressure in your chest. Have trouble breathing. Faint or feel like you will faint. Have severe and persistent vomiting. Feel confused or disoriented. These symptoms may represent a serious problem that is an emergency. Do not wait to see if the symptoms will go away. Get medical help right away. Call your local emergency services (911 in the [place] Do not drive  yourself to the hospital. Summary A respiratory infection is an illness that affects part of the respiratory system, such as the lungs, nose, or throat. A respiratory infection that is caused by a virus is called a viral respiratory infection. Common types of viral respiratory infections include a cold, influenza, and respiratory syncytial virus (RSV) infection. Symptoms of this condition include a stuffy or runny nose, cough, fatigue, achy muscles, sore throat, and fevers or chills. Antibiotic medicines are not prescribed for viral infections. This is because antibiotics are designed to kill bacteria. They are not effective against viruses. This information is not intended to replace advice given to you by your health care provider. Make sure you discuss any questions you have with your health care provider. Document Revised: 02/24/2021 Document Reviewed:  02/24/2021 Elsevier Patient Education  2024 Elsevier Inc[employer]   If you have been instructed to have an in-person evaluation today at a local Urgent Care facility, please use the link below. It will take you to a list of all of our available [facility], including address, phone number and hours of operation. Please do not delay care.  [facility]  If you or a family member do not have a primary care provider, use the link below to schedule a visit and establish care. When you choose a [facility] primary care physician or advanced practice provider, you gain a long-term partner in health. Find a Primary Care Provider  Learn more about [facility]'s in-office and virtual care options: [facility] - Get Care Now

## 2024-01-22 NOTE — Progress Notes (Signed)
 Virtual Visit Consent   Megan Ballard, you are scheduled for a virtual visit with a [facility] provider today. Just as with appointments in the office, your consent must be obtained to participate. Your consent will be active for this visit and any virtual visit you may have with one of our providers in the next 365 days. If you have a MyChart account, a copy of this consent can be sent to you electronically.  As this is a virtual visit, video technology does not allow for your provider to perform a traditional examination. This may limit your provider's ability to fully assess your condition. If your provider identifies any concerns that need to be evaluated in person or the need to arrange testing (such as labs, EKG, etc.), we will make arrangements to do so. Although advances in technology are sophisticated, we cannot ensure that it will always work on either your end or our end. If the connection with a video visit is poor, the visit may have to be switched to a telephone visit. With either a video or telephone visit, we are not always able to ensure that we have a secure connection.  By engaging in this virtual visit, you consent to the provision of healthcare and authorize for your insurance to be billed (if applicable) for the services provided during this visit. Depending on your insurance coverage, you may receive a charge related to this service.  I need to obtain your verbal consent now. Are you willing to proceed with your visit today? [name] has provided verbal consent on 01/22/2024 for a virtual visit (video or telephone). Margaretann Loveless, PA-C  Date: 01/22/2024 11:13 AM   Virtual Visit via Video Note   I, Margaretann Loveless, connected with  Megan Ballard  ([id number], 29-Jun-1981) on 01/22/24 at 10:15 AM EST by a video-enabled telemedicine application and verified that I am speaking with the correct person using two identifiers.  Location: Patient:  Virtual Visit Location Patient: Home Provider: Virtual Visit Location Provider: Home Office   I discussed the limitations of evaluation and management by telemedicine and the availability of in person appointments. The patient expressed understanding and agreed to proceed.    History of Present Illness: Megan Ballard is a 43 y.o. who identifies as a female who was assigned female at birth, and is being seen today for sore throat and congestion.  HPI: Sore Throat  This is a new problem. The current episode started in the past 7 days (3 days). The problem has been gradually worsening. There has been no fever. The pain is moderate. Associated symptoms include congestion, coughing, ear discharge (did have a bump in the left ear canal that did open and drain in her sleep), headaches, a hoarse voice (raspy), shortness of breath (with going up steps to her apartment), swollen glands and trouble swallowing. Pertinent negatives include no ear pain or plugged ear sensation. Associated symptoms comments: Hot flashes with some sweating, fatigue, sinus congestion, rhinorrhea. Treatments tried: honey, lemon, hot tea, onion soup, ginger ale. The treatment provided mild relief.     Problems:  Patient Active Problem List   Diagnosis Date Noted   Lower urinary tract infectious disease 09/06/2012   Likely kidney stone 9/13. 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    Allergies:  Allergies  Allergen Reactions   Latex Rash   Medications:  Current Outpatient Medications:    albuterol (VENTOLIN HFA) 108 (90 Base) MCG/ACT inhaler, Inhale 1-2 puffs into the lungs every 6 (six) hours as needed., Disp: 8 g, Rfl: 0    brompheniramine-pseudoephedrine-DM 30-2-10 MG/5ML syrup, Take 5 mLs by mouth 4 (four) times daily as needed., Disp: 120 mL, Rfl: 0   fluticasone (FLONASE) 50 MCG/ACT nasal spray, Place 2 sprays into both nostrils daily., Disp: 16 g, Rfl: 0   cetirizine (ZYRTEC ALLERGY) 10 MG tablet, Take 1 tablet (10 mg total) by mouth at bedtime., Disp: 30 tablet, Rfl: 0   cyclobenzaprine (FLEXERIL) 10 MG tablet, Take 1 tablet (10 mg total) by mouth 2 (two) times daily as needed for muscle spasms., Disp: 20 tablet, Rfl: 0   escitalopram (LEXAPRO) 10 MG tablet, Take 1 tablet (10 mg total) by mouth daily. Start with 1/2 tablet once daily x 6 days then one tablet once daily for the following days, Disp: 30 tablet, Rfl: 1   fluconazole (DIFLUCAN) 150 MG tablet, Take 1 tablet PO once. Repeat in 3 days if needed., Disp: 2 tablet, Rfl: 0  Current Facility-Administered Medications:    medroxyPROGESTERone (DEPO-PROVERA) injection 150 mg, 150 mg, Intramuscular, Q90 days, Hermina Staggers, MD, 150 mg at 12/29/20 0900  Observations/Objective: Patient is well-developed, well-nourished in no acute distress.  Resting comfortably at home.  Head is normocephalic, atraumatic.  No labored breathing.  Speech is clear and coherent with logical content.  Patient is alert and oriented at baseline.    Assessment and Plan: 1. Viral URI with cough (Primary) - brompheniramine-pseudoephedrine-DM 30-2-10 MG/5ML syrup; Take 5 mLs by mouth 4 (four) times daily as needed.  Dispense: 120 mL; Refill: 0 - fluticasone (FLONASE) 50 MCG/ACT nasal spray; Place 2 sprays into both nostrils daily.  Dispense: 16 g; Refill: 0 - albuterol (VENTOLIN HFA) 108 (90 Base) MCG/ACT inhaler; Inhale 1-2 puffs into the lungs every 6 (six) hours as needed.  Dispense: 8 g; Refill: 0  - Suspect viral URI - Symptomatic medications of choice over the counter as needed - Bromfed DM for cough - Albuterol for shortness of breathing, chest tightness,  - Push  fluids - Rest - Seek further evaluation if symptoms change or worsen   Follow Up Instructions: I discussed the assessment and treatment plan with the patient. The patient was provided an opportunity to ask questions and all were answered. The patient agreed with the plan and demonstrated an understanding of the instructions.  A copy of instructions were sent to the patient via MyChart unless otherwise noted below.    The patient was advised to call back or seek an in-person evaluation if the symptoms worsen or if the condition fails to improve as anticipated.    Margaretann Loveless, PA-C

## 2024-08-31 ENCOUNTER — Telehealth: Admitting: Family

## 2024-08-31 DIAGNOSIS — J208 Acute bronchitis due to other specified organisms: Secondary | ICD-10-CM | POA: Diagnosis not present

## 2024-08-31 DIAGNOSIS — B9689 Other specified bacterial agents as the cause of diseases classified elsewhere: Secondary | ICD-10-CM | POA: Diagnosis not present

## 2024-08-31 MED ORDER — PREDNISONE 10 MG (21) PO TBPK: ORAL_TABLET | ORAL | 0 refills | Status: AC

## 2024-08-31 MED ORDER — DOXYCYCLINE HYCLATE 100 MG PO TABS: 100.0000 mg | ORAL_TABLET | Freq: Two times a day (BID) | ORAL | 0 refills | Status: AC

## 2024-08-31 MED ORDER — ALBUTEROL SULFATE HFA 108 (90 BASE) MCG/ACT IN AERS: 2.0000 | INHALATION_SPRAY | Freq: Four times a day (QID) | RESPIRATORY_TRACT | 0 refills | Status: AC | PRN

## 2024-08-31 NOTE — Progress Notes (Signed)
 Virtual Visit Consent   Megan Ballard, you are scheduled for a virtual visit with a [facility] provider today. Just as with appointments in the office, your consent must be obtained to participate. Your consent will be active for this visit and any virtual visit you may have with one of our providers in the next 365 days. If you have a MyChart account, a copy of this consent can be sent to you electronically.  As this is a virtual visit, video technology does not allow for your provider to perform a traditional examination. This may limit your provider's ability to fully assess your condition. If your provider identifies any concerns that need to be evaluated in person or the need to arrange testing (such as labs, EKG, etc.), we will make arrangements to do so. Although advances in technology are sophisticated, we cannot ensure that it will always work on either your end or our end. If the connection with a video visit is poor, the visit may have to be switched to a telephone visit. With either a video or telephone visit, we are not always able to ensure that we have a secure connection.  By engaging in this virtual visit, you consent to the provision of healthcare and authorize for your insurance to be billed (if applicable) for the services provided during this visit. Depending on your insurance coverage, you may receive a charge related to this service.  I need to obtain your verbal consent now. Are you willing to proceed with your visit today? Megan Ballard has provided verbal consent on 08/31/2024 for a virtual visit (video or telephone). Bari Learn, FNP  Date: 08/31/2024 3:12 PM   Virtual Visit via Video Note   I, Bari Learn, connected with  Megan Ballard  ([id number], 12-19-1980) on 08/31/24 at  3:00 PM EDT by a video-enabled telemedicine application and verified that I am speaking with the correct person using two identifiers.  Location: Patient: Virtual Visit  Location Patient: Other: car Provider: Virtual Visit Location Provider: Home Office   I discussed the limitations of evaluation and management by telemedicine and the availability of in person appointments. The patient expressed understanding and agreed to proceed.    History of Present Illness: Megan Ballard is a 43 y.o. who identifies as a female who was assigned female at birth, and is being seen today for cough and SOB that started over a week.  HPI: Cough This is a new problem. The current episode started 1 to 4 weeks ago. The problem has been gradually worsening. The problem occurs every few minutes. The cough is Non-productive. Associated symptoms include nasal congestion, postnasal drip and shortness of breath. Pertinent negatives include no chills, ear congestion, ear pain, fever (improved from last week), headaches, myalgias, sore throat or wheezing. She has tried rest and OTC cough suppressant for the symptoms. The treatment provided mild relief.    Problems:  Patient Active Problem List   Diagnosis Date Noted   Lower urinary tract infectious disease 09/06/2012   Likely kidney stone 9/13. 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    Allergies:  Allergies  Allergen Reactions   Latex Rash   Medications:  Current Outpatient Medications:  albuterol  (VENTOLIN  HFA) 108 (90 Base) MCG/ACT inhaler, Inhale 2 puffs into the lungs every 6 (six) hours as needed for wheezing or shortness of breath., Disp: 8 g, Rfl: 0   doxycycline  (VIBRA -TABS) 100 MG tablet, Take 1 tablet (100 mg total) by mouth 2 (two) times daily., Disp: 20 tablet, Rfl: 0   predniSONE  (STERAPRED UNI-PAK 21 TAB) 10 MG (21) TBPK tablet,  Use as directed, Disp: 21 tablet, Rfl: 0   cyclobenzaprine  (FLEXERIL ) 10 MG tablet, Take 1 tablet (10 mg total) by mouth 2 (two) times daily as needed for muscle spasms., Disp: 20 tablet, Rfl: 0   escitalopram  (LEXAPRO ) 10 MG tablet, Take 1 tablet (10 mg total) by mouth daily. Start with 1/2 tablet once daily x 6 days then one tablet once daily for the following days, Disp: 30 tablet, Rfl: 1  Current Facility-Administered Medications:    medroxyPROGESTERone  (DEPO-PROVERA ) injection 150 mg, 150 mg, Intramuscular, Q90 days, Ervin, Michael L, MD, 150 mg at 12/29/20 0900  Observations/Objective: Patient is well-developed, well-nourished in no acute distress.  Resting comfortably   Head is normocephalic, atraumatic.  No labored breathing.  Speech is clear and coherent with logical content.  Patient is alert and oriented at baseline.  Dry nonproductive cough  Assessment and Plan: 1. Acute bacterial bronchitis (Primary) - doxycycline  (VIBRA -TABS) 100 MG tablet; Take 1 tablet (100 mg total) by mouth 2 (two) times daily.  Dispense: 20 tablet; Refill: 0 - predniSONE  (STERAPRED UNI-PAK 21 TAB) 10 MG (21) TBPK tablet; Use as directed  Dispense: 21 tablet; Refill: 0 - albuterol  (VENTOLIN  HFA) 108 (90 Base) MCG/ACT inhaler; Inhale 2 puffs into the lungs every 6 (six) hours as needed for wheezing or shortness of breath.  Dispense: 8 g; Refill: 0  - Take meds as prescribed - Use a cool mist humidifier  -Use saline nose sprays frequently -Force fluids -For any cough or congestion  Use plain Mucinex - regular strength or max strength is fine -For fever or aces or pains- take tylenol  or ibuprofen . -Throat lozenges if help If symptoms worsen or do not improve in the next 24-48 hours needs to be seen in person  Follow Up Instructions: I discussed the assessment and treatment plan with the patient. The patient was provided an opportunity to ask questions and all were answered. The patient agreed with the  plan and demonstrated an understanding of the instructions.  A copy of instructions were sent to the patient via MyChart unless otherwise noted below.     The patient was advised to call back or seek an in-person evaluation if the symptoms worsen or if the condition fails to improve as anticipated.    Bari Learn, FNP

## 2024-08-31 NOTE — Patient Instructions (Signed)

## 2024-12-17 ENCOUNTER — Other Ambulatory Visit: Payer: Self-pay

## 2024-12-17 ENCOUNTER — Encounter (HOSPITAL_BASED_OUTPATIENT_CLINIC_OR_DEPARTMENT_OTHER): Payer: Self-pay

## 2024-12-17 ENCOUNTER — Emergency Department (HOSPITAL_BASED_OUTPATIENT_CLINIC_OR_DEPARTMENT_OTHER)

## 2024-12-17 ENCOUNTER — Emergency Department (HOSPITAL_BASED_OUTPATIENT_CLINIC_OR_DEPARTMENT_OTHER)
Admission: EM | Admit: 2024-12-17 | Discharge: 2024-12-18 | Disposition: A | Discharge status: Home / Self Care | Source: Ambulatory Visit | Attending: Emergency Medicine | Admitting: Emergency Medicine

## 2024-12-17 DIAGNOSIS — M545 Low back pain, unspecified: Secondary | ICD-10-CM | POA: Insufficient documentation

## 2024-12-17 DIAGNOSIS — Z91040 Latex allergy status: Secondary | ICD-10-CM | POA: Diagnosis not present

## 2024-12-17 DIAGNOSIS — R1032 Left lower quadrant pain: Secondary | ICD-10-CM | POA: Diagnosis present

## 2024-12-17 DIAGNOSIS — R350 Frequency of micturition: Secondary | ICD-10-CM | POA: Insufficient documentation

## 2024-12-17 DIAGNOSIS — Z79899 Other long term (current) drug therapy: Secondary | ICD-10-CM | POA: Diagnosis not present

## 2024-12-17 DIAGNOSIS — R1030 Lower abdominal pain, unspecified: Secondary | ICD-10-CM

## 2024-12-17 LAB — COMPREHENSIVE METABOLIC PANEL WITH GFR
ALT: 14 U/L (ref 0–44)
AST: 20 U/L (ref 15–41)
Albumin: 4.6 g/dL (ref 3.5–5.0)
Alkaline Phosphatase: 97 U/L (ref 38–126)
Anion gap: 12 (ref 5–15)
BUN: 13 mg/dL (ref 6–20)
CO2: 24 mmol/L (ref 22–32)
Calcium: 9.4 mg/dL (ref 8.9–10.3)
Chloride: 103 mmol/L (ref 98–111)
Creatinine, Ser: 0.73 mg/dL (ref 0.44–1.00)
GFR, Estimated: >60 mL/min
Glucose, Bld: 111 mg/dL — ABNORMAL HIGH (ref 70–99)
Potassium: 3.7 mmol/L (ref 3.5–5.1)
Sodium: 139 mmol/L (ref 135–145)
Total Bilirubin: 0.2 mg/dL (ref 0.0–1.2)
Total Protein: 8 g/dL (ref 6.5–8.1)

## 2024-12-17 LAB — URINALYSIS, MICROSCOPIC (REFLEX): WBC, UA: NONE SEEN WBC/hpf (ref 0–5)

## 2024-12-17 LAB — CBC WITH DIFFERENTIAL/PLATELET
Abs Immature Granulocytes: 0.02 K/uL (ref 0.00–0.07)
Basophils Absolute: 0 K/uL (ref 0.0–0.1)
Basophils Relative: 0 %
Eosinophils Absolute: 0.1 K/uL (ref 0.0–0.5)
Eosinophils Relative: 1 %
HCT: 36.7 % (ref 36.0–46.0)
Hemoglobin: 12.4 g/dL (ref 12.0–15.0)
Immature Granulocytes: 0 %
Lymphocytes Relative: 37 %
Lymphs Abs: 3.1 K/uL (ref 0.7–4.0)
MCH: 28.1 pg (ref 26.0–34.0)
MCHC: 33.8 g/dL (ref 30.0–36.0)
MCV: 83.2 fL (ref 80.0–100.0)
Monocytes Absolute: 0.6 K/uL (ref 0.1–1.0)
Monocytes Relative: 7 %
Neutro Abs: 4.6 K/uL (ref 1.7–7.7)
Neutrophils Relative %: 55 %
Platelets: 322 K/uL (ref 150–400)
RBC: 4.41 MIL/uL (ref 3.87–5.11)
RDW: 13.2 % (ref 11.5–15.5)
WBC: 8.3 K/uL (ref 4.0–10.5)
nRBC: 0 % (ref 0.0–0.2)

## 2024-12-17 LAB — URINALYSIS, ROUTINE W REFLEX MICROSCOPIC
Bilirubin Urine: NEGATIVE
Glucose, UA: NEGATIVE mg/dL
Ketones, ur: NEGATIVE mg/dL
Leukocytes,Ua: NEGATIVE
Nitrite: NEGATIVE
Protein, ur: NEGATIVE mg/dL
Specific Gravity, Urine: 1.015 (ref 1.005–1.030)
pH: 6 (ref 5.0–8.0)

## 2024-12-17 LAB — PREGNANCY, URINE: Preg Test, Ur: NEGATIVE

## 2024-12-17 NOTE — ED Triage Notes (Signed)
 Pt was seen at [facility], [place] sent here for further evaluation for possible kidney stone Left flank pain started yesterday Urgency and frequency

## 2024-12-18 MED ORDER — KETOROLAC TROMETHAMINE 60 MG/2ML IM SOLN
30.0000 mg | Freq: Once | INTRAMUSCULAR | Status: DC
  Filled 2024-12-18: qty 2

## 2024-12-18 MED ORDER — MELOXICAM 7.5 MG PO TABS: 7.5000 mg | ORAL_TABLET | Freq: Every day | ORAL | 0 refills | Status: AC

## 2024-12-18 MED ORDER — MELOXICAM 7.5 MG PO TABS: 7.5000 mg | ORAL_TABLET | Freq: Every day | ORAL | 0 refills | Status: DC

## 2024-12-18 MED ORDER — KETOROLAC TROMETHAMINE 30 MG/ML IJ SOLN
30.0000 mg | Freq: Once | INTRAMUSCULAR | Status: AC
  Administered 2024-12-18: 30 mg via INTRAVENOUS

## 2024-12-18 NOTE — ED Notes (Signed)
 RN provided AVS using Teachback Method. Patient verbalizes understanding of Discharge Instructions. Opportunity for Questioning and Answers were provided by RN. Patient Discharged from ED ambulatory to home via self.

## 2024-12-18 NOTE — ED Provider Notes (Signed)
 " [place] [facility] Provider Note   CSN: [id number] Arrival date & time: 12/17/24  2015     Patient presents with: Flank Pain and Urinary Frequency   Megan Ballard is a 44 y.o. female.   The history is provided by the patient.  Flank Pain This is a new problem. The current episode started yesterday. The problem occurs constantly. The problem has not changed since onset.Pertinent negatives include no chest pain, no headaches and no shortness of breath. Associated symptoms comments: Groin pain . Nothing aggravates the symptoms. Nothing relieves the symptoms. She has tried nothing for the symptoms. The treatment provided no relief.  Urinary Frequency Pertinent negatives include no chest pain, no headaches and no shortness of breath.  Sent by UC for rule out kidney stone.       Prior to Admission medications  Medication Sig Start Date End Date Taking? Authorizing Provider  meloxicam  (MOBIC ) 7.5 MG tablet Take 1 tablet (7.5 mg total) by mouth daily. 12/18/24  Yes Jeanifer Halliday, MD  albuterol  (VENTOLIN  HFA) 108 (90 Base) MCG/ACT inhaler Inhale 2 puffs into the lungs every 6 (six) hours as needed for wheezing or shortness of breath. 08/31/24   Lavell Bari LABOR, FNP  cyclobenzaprine  (FLEXERIL ) 10 MG tablet Take 1 tablet (10 mg total) by mouth 2 (two) times daily as needed for muscle spasms. 01/17/22   Billy Asberry FALCON, PA-C  doxycycline  (VIBRA -TABS) 100 MG tablet Take 1 tablet (100 mg total) by mouth 2 (two) times daily. 08/31/24   Lavell Bari LABOR, FNP  escitalopram  (LEXAPRO ) 10 MG tablet Take 1 tablet (10 mg total) by mouth daily. Start with 1/2 tablet once daily x 6 days then one tablet once daily for the following days 10/23/23   Lucienne Loveless, PA-C  predniSONE  (STERAPRED UNI-PAK 21 TAB) 10 MG (21) TBPK tablet Use as directed 08/31/24   Lavell Bari A, FNP    Allergies: Latex    Review of Systems  Respiratory:  Negative for shortness of  breath.   Cardiovascular:  Negative for chest pain.  Gastrointestinal:  Negative for nausea and vomiting.  Genitourinary:  Positive for flank pain and frequency.  Neurological:  Negative for headaches.  All other systems reviewed and are negative.   Updated Vital Signs BP 124/86 (BP Location: Right Arm)   Pulse 74   Temp 98 F (36.7 C) (Oral)   Resp 16   Ht 5' 4 (1.626 m)   Wt 90.7 kg   LMP 11/29/2024 (Exact Date)   SpO2 100%   BMI 34.33 kg/m   Physical Exam Vitals and nursing note reviewed.  Constitutional:      General: She is not in acute distress.    Appearance: Normal appearance. She is well-developed.  HENT:     Head: Normocephalic and atraumatic.     Nose: Nose normal.  Eyes:     Pupils: Pupils are equal, round, and reactive to light.  Cardiovascular:     Rate and Rhythm: Normal rate and regular rhythm.     Pulses: Normal pulses.     Heart sounds: Normal heart sounds.  Pulmonary:     Effort: Pulmonary effort is normal. No respiratory distress.     Breath sounds: Normal breath sounds.  Abdominal:     General: Bowel sounds are normal. There is no distension.     Palpations: Abdomen is soft.     Tenderness: There is no abdominal tenderness. There is no guarding or rebound.  Hernia: No hernia is present.  Musculoskeletal:        General: Normal range of motion.     Cervical back: Neck supple.  Skin:    General: Skin is warm and dry.     Capillary Refill: Capillary refill takes less than 2 seconds.     Findings: No erythema or rash.  Neurological:     General: No focal deficit present.     Mental Status: She is alert.     Deep Tendon Reflexes: Reflexes normal.  Psychiatric:        Mood and Affect: Mood normal.     (all labs ordered are listed, but only abnormal results are displayed) Results for orders placed or performed during the hospital encounter of 12/17/24  Urinalysis, Routine w reflex microscopic -Urine, Clean Catch   Collection Time:  12/17/24  8:25 PM  Result Value Ref Range   Color, Urine YELLOW YELLOW   APPearance CLEAR CLEAR   Specific Gravity, Urine 1.015 1.005 - 1.030   pH 6.0 5.0 - 8.0   Glucose, UA NEGATIVE NEGATIVE mg/dL   Hgb urine dipstick MODERATE (A) NEGATIVE   Bilirubin Urine NEGATIVE NEGATIVE   Ketones, ur NEGATIVE NEGATIVE mg/dL   Protein, ur NEGATIVE NEGATIVE mg/dL   Nitrite NEGATIVE NEGATIVE   Leukocytes,Ua NEGATIVE NEGATIVE  Urinalysis, Microscopic (reflex)   Collection Time: 12/17/24  8:25 PM  Result Value Ref Range   RBC / HPF 6-10 0 - 5 RBC/hpf   WBC, UA NONE SEEN 0 - 5 WBC/hpf   Bacteria, UA RARE (A) NONE SEEN   Squamous Epithelial / HPF 0-5 0 - 5 /HPF  CBC with Differential   Collection Time: 12/17/24  8:28 PM  Result Value Ref Range   WBC 8.3 4.0 - 10.5 K/uL   RBC 4.41 3.87 - 5.11 MIL/uL   Hemoglobin 12.4 12.0 - 15.0 g/dL   HCT 63.2 63.9 - 53.9 %   MCV 83.2 80.0 - 100.0 fL   MCH 28.1 26.0 - 34.0 pg   MCHC 33.8 30.0 - 36.0 g/dL   RDW 86.7 88.4 - 84.4 %   Platelets 322 150 - 400 K/uL   nRBC 0.0 0.0 - 0.2 %   Neutrophils Relative % 55 %   Neutro Abs 4.6 1.7 - 7.7 K/uL   Lymphocytes Relative 37 %   Lymphs Abs 3.1 0.7 - 4.0 K/uL   Monocytes Relative 7 %   Monocytes Absolute 0.6 0.1 - 1.0 K/uL   Eosinophils Relative 1 %   Eosinophils Absolute 0.1 0.0 - 0.5 K/uL   Basophils Relative 0 %   Basophils Absolute 0.0 0.0 - 0.1 K/uL   Immature Granulocytes 0 %   Abs Immature Granulocytes 0.02 0.00 - 0.07 K/uL  Comprehensive metabolic panel   Collection Time: 12/17/24  8:28 PM  Result Value Ref Range   Sodium 139 135 - 145 mmol/L   Potassium 3.7 3.5 - 5.1 mmol/L   Chloride 103 98 - 111 mmol/L   CO2 24 22 - 32 mmol/L   Glucose, Bld 111 (H) 70 - 99 mg/dL   BUN 13 6 - 20 mg/dL   Creatinine, Ser 9.26 0.44 - 1.00 mg/dL   Calcium 9.4 8.9 - 89.6 mg/dL   Total Protein 8.0 6.5 - 8.1 g/dL   Albumin 4.6 3.5 - 5.0 g/dL   AST 20 15 - 41 U/L   ALT 14 0 - 44 U/L   Alkaline Phosphatase 97 38 -  126 U/L   Total  Bilirubin 0.2 0.0 - 1.2 mg/dL   GFR, Estimated >39 >39 mL/min   Anion gap 12 5 - 15  Pregnancy, urine   Collection Time: 12/17/24  8:28 PM  Result Value Ref Range   Preg Test, Ur NEGATIVE NEGATIVE   CT Renal Stone Study Result Date: 12/17/2024 CLINICAL DATA:  Flank pain EXAM: CT ABDOMEN AND PELVIS WITHOUT CONTRAST TECHNIQUE: Multidetector CT imaging of the abdomen and pelvis was performed following the standard protocol without IV contrast. RADIATION DOSE REDUCTION: This exam was performed according to the departmental dose-optimization program which includes automated exposure control, adjustment of the mA and/or kV according to patient size and/or use of iterative reconstruction technique. COMPARISON:  None Available. FINDINGS: Lower chest: No acute abnormality. Hepatobiliary: Probable contracted gallbladder. No calcified stones. No biliary dilatation Pancreas: Unremarkable. No pancreatic ductal dilatation or surrounding inflammatory changes. Spleen: Normal in size without focal abnormality. Adrenals/Urinary Tract: Adrenal glands are unremarkable. Kidneys are normal, without renal calculi, focal lesion, or hydronephrosis. Bladder is unremarkable. Stomach/Bowel: Stomach is within normal limits. Appendix appears normal. No evidence of bowel wall thickening, distention, or inflammatory changes. Vascular/Lymphatic: No significant vascular findings are present. No enlarged abdominal or pelvic lymph nodes. Reproductive: Uterus and bilateral adnexa are unremarkable. Other: No ascites or free air Musculoskeletal: No acute or significant osseous findings. IMPRESSION: No CT evidence for acute intra-abdominal or pelvic abnormality. Negative for hydronephrosis or ureteral stone. Electronically Signed   By: Luke Bun M.D.   On: 12/17/2024 23:25     Radiology: CT Renal Stone Study Result Date: 12/17/2024 CLINICAL DATA:  Flank pain EXAM: CT ABDOMEN AND PELVIS WITHOUT CONTRAST TECHNIQUE:  Multidetector CT imaging of the abdomen and pelvis was performed following the standard protocol without IV contrast. RADIATION DOSE REDUCTION: This exam was performed according to the departmental dose-optimization program which includes automated exposure control, adjustment of the mA and/or kV according to patient size and/or use of iterative reconstruction technique. COMPARISON:  None Available. FINDINGS: Lower chest: No acute abnormality. Hepatobiliary: Probable contracted gallbladder. No calcified stones. No biliary dilatation Pancreas: Unremarkable. No pancreatic ductal dilatation or surrounding inflammatory changes. Spleen: Normal in size without focal abnormality. Adrenals/Urinary Tract: Adrenal glands are unremarkable. Kidneys are normal, without renal calculi, focal lesion, or hydronephrosis. Bladder is unremarkable. Stomach/Bowel: Stomach is within normal limits. Appendix appears normal. No evidence of bowel wall thickening, distention, or inflammatory changes. Vascular/Lymphatic: No significant vascular findings are present. No enlarged abdominal or pelvic lymph nodes. Reproductive: Uterus and bilateral adnexa are unremarkable. Other: No ascites or free air Musculoskeletal: No acute or significant osseous findings. IMPRESSION: No CT evidence for acute intra-abdominal or pelvic abnormality. Negative for hydronephrosis or ureteral stone. Electronically Signed   By: Luke Bun M.D.   On: 12/17/2024 23:25     Procedures   Medications Ordered in the ED  ketorolac  (TORADOL ) 30 MG/ML injection 30 mg (30 mg Intravenous Given 12/18/24 0035)                                    Medical Decision Making Patient with left groin and low back pain x 1 day   Amount and/or Complexity of Data Reviewed External Data Reviewed: notes.    Details: Previous notes  Labs: ordered.    Details: Urine negative for UTI, normal sodium 139, normal potassium 3.7, normal creatinine. Normal white count 8.3, normal  hemoglobin 12.4, normal platelets  Radiology: ordered and independent interpretation  performed.    Details: Negative CT  Risk Prescription drug management. Risk Details: Well appearing, exam vitals labs and imaging are benign and reassuring.  I suspect this is a muscle strain.  There is no signs of a uti nor infection.  Stable for discharge with close follow up.  Strict returns      Final diagnoses:  None   No signs of systemic illness or infection. The patient is nontoxic-appearing on exam and vital signs are within normal limits.  I have reviewed the triage vital signs and the nursing notes. Pertinent labs & imaging results that were available during my care of the patient were reviewed by me and considered in my medical decision making (see chart for details). After history, exam, and medical workup I feel the patient has been appropriately medically screened and is safe for discharge home. Pertinent diagnoses were discussed with the patient. Patient was given return precautions.    ED Discharge Orders          Ordered    meloxicam  (MOBIC ) 7.5 MG tablet  Daily        12/18/24 0045               Sitlaly Gudiel, MD 12/18/24 9947  "

## 2024-12-23 ENCOUNTER — Telehealth
